# Patient Record
Sex: Male | Born: 1990 | Race: Black or African American | Hispanic: No | Marital: Single | Smoking: Current every day smoker
Health system: Southern US, Community
[De-identification: ages and names within clinical notes are randomized; demographics above are authoritative.]

## PROBLEM LIST (undated history)

## (undated) DIAGNOSIS — J302 Other seasonal allergic rhinitis: Secondary | ICD-10-CM

## (undated) DIAGNOSIS — J40 Bronchitis, not specified as acute or chronic: Secondary | ICD-10-CM

## (undated) DIAGNOSIS — K5190 Ulcerative colitis, unspecified, without complications: Secondary | ICD-10-CM

---
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
## 2010-10-31 NOTE — Op Note (Signed)
NAME:  Roy Arellano, Roy Arellano                 ACCOUNT NO.:  [id number]   MEDICAL RECORD NO.:  [id number]          PATIENT TYPE:  AMB   LOCATION:  SDS                          FACILITY:  [facility]   PHYSICIAN:  Jon Gills, M.D.  DATE OF BIRTH:  02/19/1991   DATE OF PROCEDURE:  08/06/2008  DATE OF DISCHARGE:  08/06/2008                               OPERATIVE REPORT   PREOPERATIVE DIAGNOSIS:  Lower gastrointestinal bleeding.   POSTOPERATIVE DIAGNOSIS:  Diffuse colitis.   PROCEDURE:  Colonoscopy with biopsy.   SURGEON:  Jon Gills, MD   ASSISTANTS:  None.   DESCRIPTION OF FINDINGS:  Following informed written consent, the  patient was taken to the operating room and placed under general  anesthesia with continuous cardiopulmonary monitoring.  He remained in  the supine position and examination of the perineum revealed no tags or  fissures.  Digital examination of the rectum revealed an empty rectal  vault.  The Pentax colonoscope was passed per rectum and advanced  without difficulty 140 cm which corresponded to the mid ascending colon.  The entire colonic mucosa revealed diffuse granularity, edema, and  erythema with relative sparing in the sigmoid colon.  I was unable to  advance the endoscope into the cecum or visualize the terminal ileum.  Multiple biopsies were obtained throughout the colon, which revealed  chronic inflammation consistent with inflammatory bowel disease.  The  colonoscope was gradually removed, and the patient was awakened and  taken to recovery room in satisfactory condition.  He will be released  later today to the care of his family.  He was placed on Azulfidine 1.5  g p.o. b.i.d. with plans to schedule a radiographic small bowel series  in the near future.   DESCRIPTION OF TECHNICAL PROCEDURES USED:  Pentax colonoscope with cold  biopsy forceps.   DESCRIPTION OF SPECIMENS REMOVED:  Ascending colon x3 in formalin,  descending colon x3 in formalin, and rectum  x3 in formalin.           ______________________________  Jon Gills, M.D.     JHC/MEDQ  D:  08/23/2008  T:  08/24/2008  Job:  [id number]   cc:   Percell Locus, MD

## 2011-02-26 ENCOUNTER — Other Ambulatory Visit: Payer: Self-pay | Admitting: Pediatrics

## 2011-02-27 NOTE — Telephone Encounter (Signed)
Will get chart.

## 2011-07-25 ENCOUNTER — Other Ambulatory Visit: Payer: Self-pay | Admitting: Pediatrics

## 2011-07-25 DIAGNOSIS — K5190 Ulcerative colitis, unspecified, without complications: Secondary | ICD-10-CM

## 2012-05-09 ENCOUNTER — Encounter (HOSPITAL_BASED_OUTPATIENT_CLINIC_OR_DEPARTMENT_OTHER): Payer: Self-pay | Admitting: Emergency Medicine

## 2012-05-09 ENCOUNTER — Emergency Department (HOSPITAL_BASED_OUTPATIENT_CLINIC_OR_DEPARTMENT_OTHER)
Admission: EM | Admit: 2012-05-09 | Discharge: 2012-05-09 | Disposition: A | Discharge status: Home / Self Care | Payer: PRIVATE HEALTH INSURANCE | Attending: Emergency Medicine | Admitting: Emergency Medicine

## 2012-05-09 DIAGNOSIS — L0201 Cutaneous abscess of face: Secondary | ICD-10-CM | POA: Insufficient documentation

## 2012-05-09 DIAGNOSIS — Z8709 Personal history of other diseases of the respiratory system: Secondary | ICD-10-CM | POA: Insufficient documentation

## 2012-05-09 DIAGNOSIS — L03211 Cellulitis of face: Secondary | ICD-10-CM | POA: Insufficient documentation

## 2012-05-09 DIAGNOSIS — L299 Pruritus, unspecified: Secondary | ICD-10-CM | POA: Insufficient documentation

## 2012-05-09 DIAGNOSIS — K5190 Ulcerative colitis, unspecified, without complications: Secondary | ICD-10-CM | POA: Insufficient documentation

## 2012-05-09 DIAGNOSIS — L089 Local infection of the skin and subcutaneous tissue, unspecified: Secondary | ICD-10-CM

## 2012-05-09 DIAGNOSIS — Z79899 Other long term (current) drug therapy: Secondary | ICD-10-CM | POA: Insufficient documentation

## 2012-05-09 HISTORY — DX: Ulcerative colitis, unspecified, without complications: K51.90

## 2012-05-09 HISTORY — DX: Bronchitis, not specified as acute or chronic: J40

## 2012-05-09 MED ORDER — MUPIROCIN CALCIUM 2 % NA OINT: TOPICAL_OINTMENT | NASAL | Status: DC

## 2012-05-09 NOTE — ED Notes (Signed)
2cm circular area of rash on chin.

## 2012-05-09 NOTE — ED Provider Notes (Signed)
History     CSN: [id number]  Arrival date & time 05/09/12  2005   First MD Initiated Contact with Patient 05/09/12 2053      Chief Complaint  Patient presents with  . Rash    (Consider location/radiation/quality/duration/timing/severity/associated sxs/prior treatment) Patient is a 21 y.o. male presenting with rash. The history is provided by the patient. No language interpreter was used.  Rash  This is a new problem. The current episode started more than 1 week ago. The problem has been gradually worsening. The problem is associated with nothing. There has been no fever. The rash is present on the face. The pain is at a severity of 0/10. The patient is experiencing no pain. Associated symptoms include itching. Treatments tried: blistex and neosporin. Risk factors include new medications.    Past Medical History  Diagnosis Date  . Ulcerative colitis   . Bronchitis     History reviewed. No pertinent past surgical history.  No family history on file.  History  Substance Use Topics  . Smoking status: Never Smoker   . Smokeless tobacco: Never Used  . Alcohol Use: No      Review of Systems  Skin: Positive for itching and rash.  All other systems reviewed and are negative.    Allergies  Review of patient's allergies indicates no known allergies.  Home Medications     BP 120/65  Pulse 81  Temp 99 F (37.2 C) (Oral)  Resp 16  Ht 5\' 10"  (1.778 m)  Wt 135 lb (61.236 kg)  BMI 19.37 kg/m2  SpO2 100%  Physical Exam  Nursing note and vitals reviewed. Constitutional: He is oriented to person, place, and time. He appears well-developed and well-nourished.  HENT:  Head: Normocephalic and atraumatic.       1x1.5 cm area of erythema  Musculoskeletal: Normal range of motion.  Neurological: He is alert and oriented to person, place, and time. He has normal reflexes.  Skin: There is erythema.  Psychiatric: He has a normal mood and affect.    ED Course  Procedures  (including critical care time)  Labs Reviewed - No data to display No results found.   No diagnosis found.    MDM  Area does not appear herpetic,   Possible infection       Lonia Skinner [place], [place] 05/09/12 2115

## 2012-05-09 NOTE — ED Notes (Signed)
Skin rash on chin,dry,pink area, pt sts area itches.

## 2012-05-12 NOTE — ED Provider Notes (Signed)
Medical screening examination/treatment/procedure(s) were performed by non-physician practitioner and as supervising physician I was immediately available for consultation/collaboration.  Raeford Razor, MD 05/12/12 [phone]

## 2012-09-01 ENCOUNTER — Emergency Department (HOSPITAL_BASED_OUTPATIENT_CLINIC_OR_DEPARTMENT_OTHER)
Admission: EM | Admit: 2012-09-01 | Discharge: 2012-09-02 | Disposition: A | Discharge status: Home / Self Care | Payer: No Typology Code available for payment source | Attending: Emergency Medicine | Admitting: Emergency Medicine

## 2012-09-01 ENCOUNTER — Encounter (HOSPITAL_BASED_OUTPATIENT_CLINIC_OR_DEPARTMENT_OTHER): Payer: Self-pay | Admitting: *Deleted

## 2012-09-01 DIAGNOSIS — T148XXA Other injury of unspecified body region, initial encounter: Secondary | ICD-10-CM | POA: Insufficient documentation

## 2012-09-01 DIAGNOSIS — S0993XA Unspecified injury of face, initial encounter: Secondary | ICD-10-CM | POA: Insufficient documentation

## 2012-09-01 DIAGNOSIS — Z8719 Personal history of other diseases of the digestive system: Secondary | ICD-10-CM | POA: Insufficient documentation

## 2012-09-01 DIAGNOSIS — Y9389 Activity, other specified: Secondary | ICD-10-CM | POA: Insufficient documentation

## 2012-09-01 DIAGNOSIS — Y92410 Unspecified street and highway as the place of occurrence of the external cause: Secondary | ICD-10-CM | POA: Insufficient documentation

## 2012-09-01 DIAGNOSIS — S0990XA Unspecified injury of head, initial encounter: Secondary | ICD-10-CM | POA: Insufficient documentation

## 2012-09-01 DIAGNOSIS — Z8709 Personal history of other diseases of the respiratory system: Secondary | ICD-10-CM | POA: Insufficient documentation

## 2012-09-01 DIAGNOSIS — IMO0002 Reserved for concepts with insufficient information to code with codable children: Secondary | ICD-10-CM | POA: Insufficient documentation
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
## 2012-09-02 MED ORDER — NAPROXEN SODIUM 550 MG PO TABS: 550.0000 mg | ORAL_TABLET | Freq: Two times a day (BID) | ORAL | Status: DC | PRN

## 2012-09-02 MED ORDER — NAPROXEN 250 MG PO TABS
500.0000 mg | ORAL_TABLET | Freq: Once | ORAL | Status: AC
  Administered 2012-09-02: 500 mg via ORAL
  Filled 2012-09-02: qty 2

## 2012-09-02 MED ORDER — CYCLOBENZAPRINE HCL 10 MG PO TABS: 10.0000 mg | ORAL_TABLET | Freq: Three times a day (TID) | ORAL | Status: DC | PRN
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
## 2012-09-05 NOTE — ED Notes (Signed)
Opened this chart because pt came to registration asking for work note therefore chart opened to obtain information as to his discharge instructions

## 2012-10-29 ENCOUNTER — Encounter (HOSPITAL_BASED_OUTPATIENT_CLINIC_OR_DEPARTMENT_OTHER): Payer: Self-pay | Admitting: *Deleted

## 2012-10-29 ENCOUNTER — Emergency Department (HOSPITAL_BASED_OUTPATIENT_CLINIC_OR_DEPARTMENT_OTHER)
Admission: EM | Admit: 2012-10-29 | Discharge: 2012-10-29 | Disposition: A | Discharge status: Home / Self Care | Payer: PRIVATE HEALTH INSURANCE | Attending: Emergency Medicine | Admitting: Emergency Medicine

## 2012-10-29 DIAGNOSIS — E860 Dehydration: Secondary | ICD-10-CM | POA: Insufficient documentation

## 2012-10-29 DIAGNOSIS — R112 Nausea with vomiting, unspecified: Secondary | ICD-10-CM | POA: Insufficient documentation

## 2012-10-29 DIAGNOSIS — Z8709 Personal history of other diseases of the respiratory system: Secondary | ICD-10-CM | POA: Insufficient documentation

## 2012-10-29 DIAGNOSIS — Z8719 Personal history of other diseases of the digestive system: Secondary | ICD-10-CM | POA: Insufficient documentation

## 2012-10-29 DIAGNOSIS — Z79899 Other long term (current) drug therapy: Secondary | ICD-10-CM | POA: Insufficient documentation

## 2012-10-29 LAB — URINALYSIS, ROUTINE W REFLEX MICROSCOPIC
Glucose, UA: NEGATIVE mg/dL
Ketones, ur: >80 mg/dL — AB
pH: 6.5 (ref 5.0–8.0)

## 2012-10-29 LAB — CBC WITH DIFFERENTIAL/PLATELET
Basophils Relative: 0 % (ref 0–1)
Eosinophils Absolute: 0 10*3/uL (ref 0.0–0.7)
Eosinophils Relative: 0 % (ref 0–5)
Hemoglobin: 11.9 g/dL — ABNORMAL LOW (ref 13.0–17.0)
Lymphocytes Relative: 17 % (ref 12–46)
MCHC: 33.6 g/dL (ref 30.0–36.0)
Neutrophils Relative %: 67 % (ref 43–77)
RBC: 5.5 MIL/uL (ref 4.22–5.81)
WBC: 7.2 10*3/uL (ref 4.0–10.5)

## 2012-10-29 LAB — COMPREHENSIVE METABOLIC PANEL
ALT: 15 U/L (ref 0–53)
Alkaline Phosphatase: 98 U/L (ref 39–117)
GFR calc Af Amer: >90 mL/min (ref >90)
Glucose, Bld: 100 mg/dL — ABNORMAL HIGH (ref 70–99)
Potassium: 4.3 mEq/L (ref 3.5–5.1)
Sodium: 135 mEq/L (ref 135–145)
Total Protein: 8.6 g/dL — ABNORMAL HIGH (ref 6.0–8.3)

## 2012-10-29 LAB — RAPID URINE DRUG SCREEN, HOSP PERFORMED
Amphetamines: NOT DETECTED
Barbiturates: NOT DETECTED
Benzodiazepines: NOT DETECTED
Cocaine: NOT DETECTED
Tetrahydrocannabinol: POSITIVE — AB

## 2012-10-29 MED ORDER — SODIUM CHLORIDE 0.9 % IV SOLN
1000.0000 mL | Freq: Once | INTRAVENOUS | Status: AC
  Administered 2012-10-29: 1000 mL via INTRAVENOUS

## 2012-10-29 MED ORDER — CEPHALEXIN 500 MG PO CAPS: 500.0000 mg | ORAL_CAPSULE | Freq: Four times a day (QID) | ORAL | Status: DC

## 2012-10-29 MED ORDER — ONDANSETRON 8 MG PO TBDP: 8.0000 mg | ORAL_TABLET | Freq: Three times a day (TID) | ORAL | Status: DC | PRN

## 2012-10-29 MED ORDER — SODIUM CHLORIDE 0.9 % IV SOLN
1000.0000 mL | INTRAVENOUS | Status: DC
  Administered 2012-10-29: 1000 mL via INTRAVENOUS

## 2012-10-29 MED ORDER — CEPHALEXIN 250 MG PO CAPS
250.0000 mg | ORAL_CAPSULE | Freq: Once | ORAL | Status: AC
  Administered 2012-10-29: 250 mg via ORAL
  Filled 2012-10-29: qty 1

## 2012-10-29 MED ORDER — ONDANSETRON HCL 4 MG/2ML IJ SOLN
4.0000 mg | Freq: Once | INTRAMUSCULAR | Status: AC
  Administered 2012-10-29: 4 mg via INTRAVENOUS
  Filled 2012-10-29: qty 2

## 2012-10-29 NOTE — ED Notes (Signed)
Pt c/o n/v x 4 days 

## 2012-10-29 NOTE — ED Notes (Signed)
Coke given to drink per verbal order of MD.

## 2012-10-29 NOTE — ED Provider Notes (Signed)
History     CSN: [id number]  Arrival date & time 10/29/12  1329   First MD Initiated Contact with Patient 10/29/12 1353      Chief Complaint  Patient presents with  . Emesis    (Consider location/radiation/quality/duration/timing/severity/associated sxs/prior treatment) HPI  Patient complaining of nausea and vomiting for four days.  No fever but some chills.  Patient has a history of ulcerative colitis but denies diarrhea, abdominal pain or bleeding.  He feels weak and has some lightheadedness.  He has been able to eat and drink but   Past Medical History  Diagnosis Date  . Ulcerative colitis   . Bronchitis     History reviewed. No pertinent past surgical history.  History reviewed. No pertinent family history.  History  Substance Use Topics  . Smoking status: Never Smoker   . Smokeless tobacco: Never Used  . Alcohol Use: No      Review of Systems  Allergies  Review of patient's allergies indicates no known allergies.  Home Medications     BP 118/56  Pulse 114  Temp(Src) 98.9 F (37.2 C) (Oral)  Resp 16  Ht 5\' 8"  (1.727 m)  Wt 135 lb (61.236 kg)  BMI 20.53 kg/m2  SpO2 100%  Physical Exam  Nursing note and vitals reviewed. Constitutional: He is oriented to person, place, and time. He appears well-developed and well-nourished.  HENT:  Head: Normocephalic and atraumatic.  Right Ear: External ear normal.  Left Ear: External ear normal.  Nose: Nose normal.  Mouth/Throat: Oropharynx is clear and moist.  Eyes: Conjunctivae and EOM are normal. Pupils are equal, round, and reactive to light.  Neck: Normal range of motion. Neck supple.  Cardiovascular: Normal rate, regular rhythm, normal heart sounds and intact distal pulses.   Pulmonary/Chest: Effort normal and breath sounds normal. No respiratory distress. He has no wheezes. He exhibits no tenderness.  Abdominal: Soft. Bowel sounds are normal. He exhibits no distension and no mass. There is no tenderness.  There is no guarding.  Musculoskeletal: Normal range of motion.  Neurological: He is alert and oriented to person, place, and time. He has normal reflexes. He exhibits normal muscle tone. Coordination normal.  Skin: Skin is warm and dry.  Psychiatric: He has a normal mood and affect. His behavior is normal. Judgment and thought content normal.    ED Course  Procedures (including critical care time)  Labs Reviewed  CBC WITH DIFFERENTIAL  COMPREHENSIVE METABOLIC PANEL  URINALYSIS, ROUTINE W REFLEX MICROSCOPIC  LIPASE, BLOOD  URINE RAPID DRUG SCREEN (HOSP PERFORMED)   No results found.   No diagnosis found.    MDM   Results for orders placed during the hospital encounter of 10/29/12  CBC WITH DIFFERENTIAL      Result Value Range   WBC 7.2  4.0 - 10.5 K/uL   RBC 5.50  4.22 - 5.81 MIL/uL   Hemoglobin 11.9 (*) 13.0 - 17.0 g/dL   HCT 40.9 (*) 81.1 - 91.4 %   MCV 64.4 (*) 78.0 - 100.0 fL   MCH 21.6 (*) 26.0 - 34.0 pg   MCHC 33.6  30.0 - 36.0 g/dL   RDW 78.2 (*) 95.6 - 21.3 %   Platelets 381  150 - 400 K/uL   Neutrophils Relative % 67  43 - 77 %   Lymphocytes Relative 17  12 - 46 %   Monocytes Relative 16 (*) 3 - 12 %   Eosinophils Relative 0  0 - 5 %  Basophils Relative 0  0 - 1 %   Neutro Abs 4.8  1.7 - 7.7 K/uL   Lymphs Abs 1.2  0.7 - 4.0 K/uL   Monocytes Absolute 1.2 (*) 0.1 - 1.0 K/uL   Eosinophils Absolute 0.0  0.0 - 0.7 K/uL   Basophils Absolute 0.0  0.0 - 0.1 K/uL   RBC Morphology ELLIPTOCYTES    COMPREHENSIVE METABOLIC PANEL      Result Value Range   Sodium 135  135 - 145 mEq/L   Potassium 4.3  3.5 - 5.1 mEq/L   Chloride 93 (*) 96 - 112 mEq/L   CO2 28  19 - 32 mEq/L   Glucose, Bld 100 (*) 70 - 99 mg/dL   BUN 17  6 - 23 mg/dL   Creatinine, Ser 1.47  0.50 - 1.35 mg/dL   Calcium 82.9 (*) 8.4 - 10.5 mg/dL   Total Protein 8.6 (*) 6.0 - 8.3 g/dL   Albumin 4.6  3.5 - 5.2 g/dL   AST 22  0 - 37 U/L   ALT 15  0 - 53 U/L   Alkaline Phosphatase 98  39 - 117 U/L    Total Bilirubin 0.3  0.3 - 1.2 mg/dL   GFR calc non Af Amer >90  >90 mL/min   GFR calc Af Amer >90  >90 mL/min  URINALYSIS, ROUTINE W REFLEX MICROSCOPIC      Result Value Range   Color, Urine YELLOW  YELLOW   APPearance CLEAR  CLEAR   Specific Gravity, Urine 1.037 (*) 1.005 - 1.030   pH 6.5  5.0 - 8.0   Glucose, UA NEGATIVE  NEGATIVE mg/dL   Hgb urine dipstick TRACE (*) NEGATIVE   Bilirubin Urine SMALL (*) NEGATIVE   Ketones, ur >80 (*) NEGATIVE mg/dL   Protein, ur 562 (*) NEGATIVE mg/dL   Urobilinogen, UA 1.0  0.0 - 1.0 mg/dL   Nitrite NEGATIVE  NEGATIVE   Leukocytes, UA NEGATIVE  NEGATIVE  LIPASE, BLOOD      Result Value Range   Lipase 46  11 - 59 U/L  URINE RAPID DRUG SCREEN (HOSP PERFORMED)      Result Value Range   Opiates NONE DETECTED  NONE DETECTED   Cocaine NONE DETECTED  NONE DETECTED   Benzodiazepines NONE DETECTED  NONE DETECTED   Amphetamines NONE DETECTED  NONE DETECTED   Tetrahydrocannabinol POSITIVE (*) NONE DETECTED   Barbiturates NONE DETECTED  NONE DETECTED  URINE MICROSCOPIC-ADD ON      Result Value Range   Squamous Epithelial / LPF RARE  RARE   WBC, UA 0-2  <3 WBC/hpf   RBC / HPF 0-2  <3 RBC/hpf   Bacteria, UA RARE  RARE   Urine-Other MUCOUS PRESENT       Patient with resolution of vomiting and appears rehydrated.  Rx for zofran given.  Plan zofran and advised that paradoxically thc can cause vomiting.  Patient advised to avoid and have clear liquids.  Will culture urine and start keflex for possible uti.     Hilario Quarry, MD 10/29/12 [phone]

## 2012-10-29 NOTE — ED Notes (Signed)
MD at bedside. 

## 2013-01-25 ENCOUNTER — Emergency Department (HOSPITAL_BASED_OUTPATIENT_CLINIC_OR_DEPARTMENT_OTHER)
Admission: EM | Admit: 2013-01-25 | Discharge: 2013-01-25 | Disposition: A | Discharge status: Home / Self Care | Payer: PRIVATE HEALTH INSURANCE | Attending: Emergency Medicine | Admitting: Emergency Medicine

## 2013-01-25 ENCOUNTER — Encounter (HOSPITAL_BASED_OUTPATIENT_CLINIC_OR_DEPARTMENT_OTHER): Payer: Self-pay | Admitting: Emergency Medicine

## 2013-01-25 DIAGNOSIS — B349 Viral infection, unspecified: Secondary | ICD-10-CM

## 2013-01-25 DIAGNOSIS — Z8719 Personal history of other diseases of the digestive system: Secondary | ICD-10-CM | POA: Insufficient documentation

## 2013-01-25 DIAGNOSIS — B9789 Other viral agents as the cause of diseases classified elsewhere: Secondary | ICD-10-CM | POA: Insufficient documentation

## 2013-01-25 DIAGNOSIS — Z8709 Personal history of other diseases of the respiratory system: Secondary | ICD-10-CM | POA: Insufficient documentation

## 2013-01-25 DIAGNOSIS — R112 Nausea with vomiting, unspecified: Secondary | ICD-10-CM | POA: Insufficient documentation

## 2013-01-25 DIAGNOSIS — R42 Dizziness and giddiness: Secondary | ICD-10-CM | POA: Insufficient documentation

## 2013-01-25 NOTE — ED Notes (Addendum)
Vomited 3-4 times 2 nights ago and 3 times yesterday.  Slept all evening yesterday.  Got up this morning and the N/V is gone but is "fatigued". Wants to "follow up to make sure everything is OK."  Is able to eat and drink today.

## 2013-01-25 NOTE — ED Provider Notes (Signed)
CSN: [id number]     Arrival date & time 01/25/13  1608 History    This chart was scribed for Toy Baker, MD by Quintella Reichert, ED scribe.  This patient was seen in room MH12/MH12 and the patient's care was started at 5:22 PM.     Chief Complaint  Patient presents with  . Fatigue    The history is provided by the patient. No language interpreter was used.    HPI Comments: Roy Arellano is a 22 y.o. male with h/o ulcerative colitis who presents to the Emergency Department complaining of 2 days of fatigue with associated emesis and HA.  Pt states that he developed gradually-worsening fatigue 2 nights ago while he was at work at [employer].  After he went home he vomited 3-4 times that night and 3 times yesterday.  Vomit is described as green.  He denies blood or coffee ground vomit.   He also noted a mild HA yesterday.  Pt took 2 Tylenol PM yesterday afternoon and slept through the evening.  Since yesterday he has not vomited.  Presently he states he is feeling very improved but wanted to "follow up to make sure everything is OK."  He denies fever, diarrhea, ear pain, cough, melena, abdominal pain, or any other associated symptoms.  He denies recent sick contact.   Pt medicates regularly with Lialda for his ulcerative colitis.    Past Medical History  Diagnosis Date  . Ulcerative colitis   . Bronchitis     History reviewed. No pertinent past surgical history.   No family history on file.   History  Substance Use Topics  . Smoking status: Never Smoker   . Smokeless tobacco: Never Used  . Alcohol Use: No     Review of Systems  Constitutional: Positive for fatigue. Negative for fever.  HENT: Negative for ear pain.   Respiratory: Negative for cough.   Gastrointestinal: Positive for nausea and vomiting. Negative for abdominal pain, diarrhea and blood in stool.  Neurological: Positive for headaches.  All other systems reviewed and are negative.      Allergies  Review  of patient's allergies indicates no known allergies.  Home Medications    BP 132/63  Pulse 100  Temp(Src) 98.4 F (36.9 C) (Oral)  Resp 16  Ht 5\' 9"  (1.753 m)  Wt 140 lb (63.504 kg)  BMI 20.67 kg/m2  SpO2 100%  Physical Exam  Nursing note and vitals reviewed. Constitutional: He is oriented to person, place, and time. He appears well-developed and well-nourished.  Non-toxic appearance.  HENT:  Head: Normocephalic and atraumatic.  Eyes: Conjunctivae are normal. Pupils are equal, round, and reactive to light.  Neck: Normal range of motion.  Cardiovascular: Normal rate.   Pulmonary/Chest: Effort normal.  Neurological: He is alert and oriented to person, place, and time.  Skin: Skin is warm and dry.  Psychiatric: He has a normal mood and affect.    ED Course  Procedures (including critical care time)  DIAGNOSTIC STUDIES: Oxygen Saturation is 100% on room air, normal by my interpretation.    COORDINATION OF CARE: 5:26 PM-Informed pt that symptoms were likely due to a self-limited viral infection and only symptomatic relief is currently indicated.  Advised return precautions.  Pt expressed understanding and agreed to plan.    Labs Reviewed - No data to display No results found. No diagnosis found.   MDM  Pt with resolving viral illness    I personally performed the services described in this  documentation, which was scribed in my presence. The recorded information has been reviewed and is accurate.     Toy Baker, MD 01/25/13 [phone]

## 2013-01-27 ENCOUNTER — Encounter (HOSPITAL_BASED_OUTPATIENT_CLINIC_OR_DEPARTMENT_OTHER): Payer: Self-pay | Admitting: *Deleted

## 2013-01-27 DIAGNOSIS — Z8709 Personal history of other diseases of the respiratory system: Secondary | ICD-10-CM | POA: Insufficient documentation

## 2013-01-27 DIAGNOSIS — K859 Acute pancreatitis without necrosis or infection, unspecified: Secondary | ICD-10-CM | POA: Insufficient documentation

## 2013-01-27 DIAGNOSIS — R748 Abnormal levels of other serum enzymes: Secondary | ICD-10-CM | POA: Insufficient documentation

## 2013-01-27 DIAGNOSIS — Z8719 Personal history of other diseases of the digestive system: Secondary | ICD-10-CM | POA: Insufficient documentation

## 2013-01-27 NOTE — ED Notes (Addendum)
Pt was seen here 2 days ago for the same complaint. Pt sts he began vomiting again last night and vomited until about 9 or 10 this morning. Pt sts he took 2 tylenol this am and has felt fine since. Pt is smiling, friendly and in no distress.

## 2013-01-28 ENCOUNTER — Emergency Department (HOSPITAL_BASED_OUTPATIENT_CLINIC_OR_DEPARTMENT_OTHER)
Admission: EM | Admit: 2013-01-28 | Discharge: 2013-01-28 | Disposition: A | Discharge status: Home / Self Care | Payer: PRIVATE HEALTH INSURANCE | Attending: Emergency Medicine | Admitting: Emergency Medicine

## 2013-01-28 DIAGNOSIS — K859 Acute pancreatitis without necrosis or infection, unspecified: Secondary | ICD-10-CM

## 2013-01-28 LAB — CBC WITH DIFFERENTIAL/PLATELET
Basophils Relative: 1 % (ref 0–1)
Eosinophils Relative: 1 % (ref 0–5)
Hemoglobin: 11.7 g/dL — ABNORMAL LOW (ref 13.0–17.0)
Lymphs Abs: 1.8 10*3/uL (ref 0.7–4.0)
MCH: 21.4 pg — ABNORMAL LOW (ref 26.0–34.0)
MCV: 65.8 fL — ABNORMAL LOW (ref 78.0–100.0)
Monocytes Absolute: 0.7 10*3/uL (ref 0.1–1.0)
RBC: 5.47 MIL/uL (ref 4.22–5.81)

## 2013-01-28 LAB — COMPREHENSIVE METABOLIC PANEL
AST: 23 U/L (ref 0–37)
Alkaline Phosphatase: 75 U/L (ref 39–117)
BUN: 13 mg/dL (ref 6–23)
CO2: 30 mEq/L (ref 19–32)
Chloride: 97 mEq/L (ref 96–112)
Creatinine, Ser: 0.9 mg/dL (ref 0.50–1.35)
GFR calc non Af Amer: >90 mL/min (ref >90)
Potassium: 3.8 mEq/L (ref 3.5–5.1)
Total Bilirubin: 0.6 mg/dL (ref 0.3–1.2)

## 2013-01-28 MED ORDER — ONDANSETRON 8 MG PO TBDP: ORAL_TABLET | ORAL | Status: DC

## 2013-01-28 NOTE — ED Provider Notes (Signed)
CSN: [id number]     Arrival date & time 01/27/13  2317 History     First MD Initiated Contact with Patient 01/28/13 0021     Chief Complaint  Patient presents with  . Emesis   (Consider location/radiation/quality/duration/timing/severity/associated sxs/prior Treatment) HPI Comments: Patient complains of nausea and vomiting. He was seen here 2 days with nausea and vomiting that had been going on for the 2 days prior and was diagnosed with a viral gastroenteritis. He states that the vomiting started again yesterday and he had several episodes of vomiting. The vomiting had restarted after eating to display steroids. He states that he took a Tylenol PM and slept through the day and hasn't had any vomiting since. He denies any abdominal pain. He denies any fevers or chills. He denies any diarrhea or blood in his stool. He does have a history of ulcerative colitis but this has not seemed similar to his past flareups of ulcerative colitis.  Patient is a 22 y.o. male presenting with vomiting.  Emesis Associated symptoms: no abdominal pain, no arthralgias, no chills, no diarrhea and no headaches     Past Medical History  Diagnosis Date  . Ulcerative colitis   . Bronchitis    History reviewed. No pertinent past surgical history. No family history on file. History  Substance Use Topics  . Smoking status: Never Smoker   . Smokeless tobacco: Never Used  . Alcohol Use: No    Review of Systems  Constitutional: Negative for fever, chills, diaphoresis and fatigue.  HENT: Negative for congestion, rhinorrhea and sneezing.   Eyes: Negative.   Respiratory: Negative for cough, chest tightness and shortness of breath.   Cardiovascular: Negative for chest pain and leg swelling.  Gastrointestinal: Positive for nausea and vomiting. Negative for abdominal pain, diarrhea and blood in stool.  Genitourinary: Negative for frequency, hematuria, flank pain and difficulty urinating.  Musculoskeletal: Negative  for back pain and arthralgias.  Skin: Negative for rash.  Neurological: Negative for dizziness, speech difficulty, weakness, numbness and headaches.    Allergies  Review of patient's allergies indicates no known allergies.  Home Medications    BP 130/59  Pulse 86  Temp(Src) 98.4 F (36.9 C) (Oral)  Resp 16  Wt 140 lb (63.504 kg)  BMI 20.67 kg/m2  SpO2 100% Physical Exam  Constitutional: He is oriented to person, place, and time. He appears well-developed and well-nourished.  HENT:  Head: Normocephalic and atraumatic.  Eyes: Pupils are equal, round, and reactive to light.  Neck: Normal range of motion. Neck supple.  Cardiovascular: Normal rate, regular rhythm and normal heart sounds.   Pulmonary/Chest: Effort normal and breath sounds normal. No respiratory distress. He has no wheezes. He has no rales. He exhibits no tenderness.  Abdominal: Soft. Bowel sounds are normal. There is no tenderness. There is no rebound and no guarding.  Musculoskeletal: Normal range of motion. He exhibits no edema.  Lymphadenopathy:    He has no cervical adenopathy.  Neurological: He is alert and oriented to person, place, and time.  Skin: Skin is warm and dry. No rash noted.  Psychiatric: He has a normal mood and affect.    ED Course   Procedures (including critical care time)  Results for orders placed during the hospital encounter of 01/28/13  CBC WITH DIFFERENTIAL      Result Value Range   WBC 6.1  4.0 - 10.5 K/uL   RBC 5.47  4.22 - 5.81 MIL/uL   Hemoglobin 11.7 (*) 13.0 - 17.0  g/dL   HCT 45.4 (*) 09.8 - 11.9 %   MCV 65.8 (*) 78.0 - 100.0 fL   MCH 21.4 (*) 26.0 - 34.0 pg   MCHC 32.5  30.0 - 36.0 g/dL   RDW 14.7 (*) 82.9 - 56.2 %   Platelets 299  150 - 400 K/uL   Neutrophils Relative % 57  43 - 77 %   Lymphocytes Relative 29  12 - 46 %   Monocytes Relative 12  3 - 12 %   Eosinophils Relative 1  0 - 5 %   Basophils Relative 1  0 - 1 %   Neutro Abs 3.4  1.7 - 7.7 K/uL   Lymphs Abs  1.8  0.7 - 4.0 K/uL   Monocytes Absolute 0.7  0.1 - 1.0 K/uL   Eosinophils Absolute 0.1  0.0 - 0.7 K/uL   Basophils Absolute 0.1  0.0 - 0.1 K/uL   RBC Morphology TARGET CELLS     Smear Review LARGE PLATELETS PRESENT    COMPREHENSIVE METABOLIC PANEL      Result Value Range   Sodium 137  135 - 145 mEq/L   Potassium 3.8  3.5 - 5.1 mEq/L   Chloride 97  96 - 112 mEq/L   CO2 30  19 - 32 mEq/L   Glucose, Bld 98  70 - 99 mg/dL   BUN 13  6 - 23 mg/dL   Creatinine, Ser 1.30  0.50 - 1.35 mg/dL   Calcium 86.5  8.4 - 78.4 mg/dL   Total Protein 7.8  6.0 - 8.3 g/dL   Albumin 4.8  3.5 - 5.2 g/dL   AST 23  0 - 37 U/L   ALT 15  0 - 53 U/L   Alkaline Phosphatase 75  39 - 117 U/L   Total Bilirubin 0.6  0.3 - 1.2 mg/dL   GFR calc non Af Amer >90  >90 mL/min   GFR calc Af Amer >90  >90 mL/min  LIPASE, BLOOD      Result Value Range   Lipase 130 (*) 11 - 59 U/L   No results found.   No results found. 1. Pancreatitis     MDM  Patient presents with nausea and vomiting. His lipase is minimally elevated. This could be the cause of his symptoms. It didn't seem to be triggered after each inflation which. He has no symptoms of abdominal pain that would be concerning for cholecystitis. He has no vomiting here in the ED. He's otherwise well-appearing. I advised them in clear liquids for the next 48 hours and then to slowly progress after that. I gave him referral to followup with a gastroenterologist. Advised to return here as needed for any worsening symptoms.  Rolan Bucco, MD 01/28/13 [phone]

## 2013-04-06 ENCOUNTER — Emergency Department (HOSPITAL_BASED_OUTPATIENT_CLINIC_OR_DEPARTMENT_OTHER): Payer: PRIVATE HEALTH INSURANCE

## 2013-04-06 ENCOUNTER — Encounter (HOSPITAL_BASED_OUTPATIENT_CLINIC_OR_DEPARTMENT_OTHER): Payer: Self-pay | Admitting: Emergency Medicine

## 2013-04-06 ENCOUNTER — Emergency Department (HOSPITAL_BASED_OUTPATIENT_CLINIC_OR_DEPARTMENT_OTHER)
Admission: EM | Admit: 2013-04-06 | Discharge: 2013-04-06 | Disposition: A | Discharge status: Home / Self Care | Payer: PRIVATE HEALTH INSURANCE | Attending: Emergency Medicine | Admitting: Emergency Medicine

## 2013-04-06 DIAGNOSIS — B9789 Other viral agents as the cause of diseases classified elsewhere: Secondary | ICD-10-CM | POA: Insufficient documentation

## 2013-04-06 DIAGNOSIS — Z8709 Personal history of other diseases of the respiratory system: Secondary | ICD-10-CM | POA: Insufficient documentation

## 2013-04-06 DIAGNOSIS — R509 Fever, unspecified: Secondary | ICD-10-CM | POA: Insufficient documentation

## 2013-04-06 DIAGNOSIS — Z8719 Personal history of other diseases of the digestive system: Secondary | ICD-10-CM | POA: Insufficient documentation

## 2013-04-06 DIAGNOSIS — R0602 Shortness of breath: Secondary | ICD-10-CM | POA: Insufficient documentation

## 2013-04-06 DIAGNOSIS — F17200 Nicotine dependence, unspecified, uncomplicated: Secondary | ICD-10-CM | POA: Insufficient documentation

## 2013-04-06 DIAGNOSIS — B349 Viral infection, unspecified: Secondary | ICD-10-CM

## 2013-04-06 DIAGNOSIS — J3489 Other specified disorders of nose and nasal sinuses: Secondary | ICD-10-CM | POA: Insufficient documentation

## 2013-04-06 LAB — BASIC METABOLIC PANEL
BUN: 11 mg/dL (ref 6–23)
Calcium: 10.2 mg/dL (ref 8.4–10.5)
Creatinine, Ser: 0.7 mg/dL (ref 0.50–1.35)
GFR calc Af Amer: >90 mL/min (ref >90)
GFR calc non Af Amer: >90 mL/min (ref >90)
Glucose, Bld: 139 mg/dL — ABNORMAL HIGH (ref 70–99)

## 2013-04-06 LAB — CBC WITH DIFFERENTIAL/PLATELET
Basophils Absolute: 0 10*3/uL (ref 0.0–0.1)
Eosinophils Absolute: 0 10*3/uL (ref 0.0–0.7)
Lymphs Abs: 0.5 10*3/uL — ABNORMAL LOW (ref 0.7–4.0)
MCHC: 32.6 g/dL (ref 30.0–36.0)
MCV: 67.2 fL — ABNORMAL LOW (ref 78.0–100.0)
Monocytes Absolute: 0.2 10*3/uL (ref 0.1–1.0)
Monocytes Relative: 3 % (ref 3–12)
Neutro Abs: 7.4 10*3/uL (ref 1.7–7.7)
Platelets: 232 10*3/uL (ref 150–400)
RDW: 19 % — ABNORMAL HIGH (ref 11.5–15.5)
WBC: 8.1 10*3/uL (ref 4.0–10.5)

## 2013-04-06 MED ORDER — ONDANSETRON HCL 4 MG/2ML IJ SOLN
4.0000 mg | Freq: Once | INTRAMUSCULAR | Status: AC
  Administered 2013-04-06: 4 mg via INTRAVENOUS
  Filled 2013-04-06: qty 2

## 2013-04-06 MED ORDER — SODIUM CHLORIDE 0.9 % IV BOLUS (SEPSIS)
1000.0000 mL | Freq: Once | INTRAVENOUS | Status: AC
  Administered 2013-04-06: 1000 mL via INTRAVENOUS

## 2013-04-06 MED ORDER — ONDANSETRON HCL 4 MG PO TABS: 4.0000 mg | ORAL_TABLET | Freq: Three times a day (TID) | ORAL | Status: DC | PRN

## 2013-04-06 NOTE — ED Notes (Signed)
Pt reports generalized weakness, body aches and cold symptoms unrelieved after taking OTC medications.

## 2013-04-06 NOTE — ED Provider Notes (Signed)
CSN: [id number]     Arrival date & time 04/06/13  1009 History   First MD Initiated Contact with Patient 04/06/13 1038     Chief Complaint  Patient presents with  . Cough  . Nasal Congestion  . Fever  . Shortness of Breath   (Consider location/radiation/quality/duration/timing/severity/associated sxs/prior Treatment) Patient is a 22 y.o. male presenting with cough, fever, and shortness of breath.  Cough Associated symptoms: fever and shortness of breath   Fever Associated symptoms: cough   Shortness of Breath Associated symptoms: cough and fever    Pt with history of ulcerative colitis reports about 4-5 days of URI symptoms, cough productive of clear sputum, nasal congestion, subjective fever. Associated with SOB and chest soreness as well as occasional vomiting. No diarrhea abdominal pain or blood in stools.   Past Medical History  Diagnosis Date  . Ulcerative colitis   . Bronchitis    History reviewed. No pertinent past surgical history. No family history on file. History  Substance Use Topics  . Smoking status: Current Some Day Smoker    Types: Cigarettes  . Smokeless tobacco: Never Used  . Alcohol Use: Yes     Comment: weekends    Review of Systems  Constitutional: Positive for fever.  Respiratory: Positive for cough and shortness of breath.    All other systems reviewed and are negative except as noted in HPI.   Allergies  Review of patient's allergies indicates no known allergies.  Home Medications    BP 140/74  Pulse 78  Temp(Src) 97.4 F (36.3 C) (Oral)  Resp 16  SpO2 100% Physical Exam  Nursing note and vitals reviewed. Constitutional: He is oriented to person, place, and time. He appears well-developed and well-nourished.  HENT:  Head: Normocephalic and atraumatic.  Eyes: EOM are normal. Pupils are equal, round, and reactive to light.  Neck: Normal range of motion. Neck supple.  Cardiovascular: Normal rate, normal heart sounds and intact distal  pulses.   Pulmonary/Chest: Effort normal and breath sounds normal.  Abdominal: Bowel sounds are normal. He exhibits no distension. There is no tenderness.  Musculoskeletal: Normal range of motion. He exhibits no edema and no tenderness.  Neurological: He is alert and oriented to person, place, and time. He has normal strength. No cranial nerve deficit or sensory deficit.  Skin: Skin is warm and dry. No rash noted.  Psychiatric: He has a normal mood and affect.    ED Course  Procedures (including critical care time) Labs Review Labs Reviewed  CBC WITH DIFFERENTIAL - Abnormal; Notable for the following:    Hemoglobin 10.4 (*)    HCT 31.9 (*)    MCV 67.2 (*)    MCH 21.9 (*)    RDW 19.0 (*)    Neutrophils Relative % 91 (*)    Lymphocytes Relative 6 (*)    Lymphs Abs 0.5 (*)    All other components within normal limits  BASIC METABOLIC PANEL - Abnormal; Notable for the following:    Chloride 94 (*)    Glucose, Bld 139 (*)    All other components within normal limits   Imaging Review Dg Chest 2 View  04/06/2013   CLINICAL DATA:  Flu-like symptoms; nausea and vomiting  EXAM: CHEST  2 VIEW  COMPARISON:  June 28, 2012  FINDINGS: The lungs are clear. Heart size and pulmonary vascularity are normal. No adenopathy. No bone lesions.  IMPRESSION: No abnormality noted.   Electronically Signed   By: Bretta Bang M.D.  On: 04/06/2013 11:50    EKG Interpretation   None       MDM   1. Viral syndrome     Labs and imaging reviewed and unremarkable. He has anemia at baseline, presumably from UC but no significant change.Pt feeling better and ready to go home. Has viral URI.     Charles B. Bernette Mayers, MD 04/06/13 1240

## 2013-04-06 NOTE — Discharge Instructions (Signed)
Viral Syndrome  You or your child has Viral Syndrome. It is the most common infection causing "colds" and infections in the nose, throat, sinuses, and breathing tubes. Sometimes the infection causes nausea, vomiting, or diarrhea. The germ that causes the infection is a virus. No antibiotic or other medicine will kill it. There are medicines that you can take to make you or your child more comfortable.   HOME CARE INSTRUCTIONS    Rest in bed until you start to feel better.   If you have diarrhea or vomiting, eat small amounts of crackers and toast. Soup is helpful.   Do not give aspirin or medicine that contains aspirin to children.   Only take over-the-counter or prescription medicines for pain, discomfort, or fever as directed by your caregiver.  SEEK IMMEDIATE MEDICAL CARE IF:    You or your child has not improved within one week.   You or your child has pain that is not at least partially relieved by over-the-counter medicine.   Thick, colored mucus or blood is coughed up.   Discharge from the nose becomes thick yellow or green.   Diarrhea or vomiting gets worse.   There is any major change in your or your child's condition.   You or your child develops a skin rash, stiff neck, severe headache, or are unable to hold down food or fluid.   You or your child has an oral temperature above 102 F (38.9 C), not controlled by medicine.   Your baby is older than 3 months with a rectal temperature of 102 F (38.9 C) or higher.   Your baby is 3 months old or younger with a rectal temperature of 100.4 F (38 C) or higher.  Document Released: 05/20/2006 Document Revised: 08/27/2011 Document Reviewed: 05/21/2007  [employer] Patient Information 2014 [employer].

## 2013-04-06 NOTE — ED Notes (Signed)
Pt reports cough, fever, congestion, and SHOB since Friday.  Symptoms are unrelieved after taking OTC medications.

## 2013-04-08 ENCOUNTER — Encounter (HOSPITAL_BASED_OUTPATIENT_CLINIC_OR_DEPARTMENT_OTHER): Payer: Self-pay | Admitting: Emergency Medicine

## 2013-04-08 ENCOUNTER — Emergency Department (HOSPITAL_BASED_OUTPATIENT_CLINIC_OR_DEPARTMENT_OTHER)
Admission: EM | Admit: 2013-04-08 | Discharge: 2013-04-08 | Disposition: A | Discharge status: Home / Self Care | Payer: PRIVATE HEALTH INSURANCE | Attending: Emergency Medicine | Admitting: Emergency Medicine

## 2013-04-08 DIAGNOSIS — J029 Acute pharyngitis, unspecified: Secondary | ICD-10-CM | POA: Insufficient documentation

## 2013-04-08 DIAGNOSIS — R51 Headache: Secondary | ICD-10-CM | POA: Insufficient documentation

## 2013-04-08 DIAGNOSIS — K5289 Other specified noninfective gastroenteritis and colitis: Secondary | ICD-10-CM | POA: Insufficient documentation

## 2013-04-08 DIAGNOSIS — B349 Viral infection, unspecified: Secondary | ICD-10-CM

## 2013-04-08 DIAGNOSIS — R630 Anorexia: Secondary | ICD-10-CM

## 2013-04-08 DIAGNOSIS — B9789 Other viral agents as the cause of diseases classified elsewhere: Secondary | ICD-10-CM | POA: Insufficient documentation

## 2013-04-08 DIAGNOSIS — F17200 Nicotine dependence, unspecified, uncomplicated: Secondary | ICD-10-CM | POA: Insufficient documentation

## 2013-04-08 DIAGNOSIS — Z8709 Personal history of other diseases of the respiratory system: Secondary | ICD-10-CM | POA: Insufficient documentation

## 2013-04-08 DIAGNOSIS — K529 Noninfective gastroenteritis and colitis, unspecified: Secondary | ICD-10-CM

## 2013-04-08 NOTE — ED Notes (Signed)
Pt is feeling better states he just thought he should be 100  % now after having GI bug on Monday also per pts chart the med prescribed was zofran for nausea Spoke with pt about hydrating at home with gatorade etc and the [employer] as well as how to advance diet. Pt verbalizes understanding

## 2013-04-08 NOTE — ED Provider Notes (Signed)
CSN: [id number]     Arrival date & time 04/08/13  0909 History   First MD Initiated Contact with Patient 04/08/13 [phone]     Chief Complaint  Patient presents with  . decreased appetite after virus     HPI  Patient seen and evaluated 2 days ago. Diagnosed with viral syndrome after reassuring labs are normal chest x-ray. He states this is a poor appetite and body aches. His sugar breath. There have rash or joint pain. No chest pain or shortness of breath. No sinus pressure. His sore throat is improving no ear pain. Mild headache. No neck stiffness. He states in general he does for better than 2 days ago "it's not long".  Past Medical History  Diagnosis Date  . Ulcerative colitis   . Bronchitis    History reviewed. No pertinent past surgical history. History reviewed. No pertinent family history. History  Substance Use Topics  . Smoking status: Current Some Day Smoker    Types: Cigarettes  . Smokeless tobacco: Never Used  . Alcohol Use: Yes     Comment: weekends    Review of Systems  Constitutional: Negative for fever, chills, diaphoresis, appetite change and fatigue.  HENT: Positive for sore throat. Negative for mouth sores and trouble swallowing.   Eyes: Negative for visual disturbance.  Respiratory: Negative for cough, chest tightness, shortness of breath and wheezing.   Cardiovascular: Negative for chest pain.  Gastrointestinal: Negative for nausea, vomiting, abdominal pain, diarrhea and abdominal distention.  Endocrine: Negative for polydipsia, polyphagia and polyuria.  Genitourinary: Negative for dysuria, frequency and hematuria.  Musculoskeletal: Negative for gait problem.  Skin: Negative for color change, pallor and rash.  Neurological: Positive for headaches. Negative for dizziness, syncope and light-headedness.  Hematological: Does not bruise/bleed easily.  Psychiatric/Behavioral: Negative for behavioral problems and confusion.    Allergies  Review of patient's  allergies indicates no known allergies.  Home Medications    BP 166/78  Pulse 79  Temp(Src) 98.3 F (36.8 C) (Oral)  Resp 20  Ht 5\' 8"  (1.727 m)  Wt 135 lb (61.236 kg)  BMI 20.53 kg/m2  SpO2 98% Physical Exam  Constitutional: He is oriented to person, place, and time. He appears well-developed and well-nourished. No distress.  HENT:  Head: Normocephalic.  Pharynx is benign. No exudate. No petechiae. No adenopathy in the neck.  Eyes: Conjunctivae are normal. Pupils are equal, round, and reactive to light. No scleral icterus.  Neck: Normal range of motion. Neck supple. No thyromegaly present.  No meningismus  Cardiovascular: Normal rate and regular rhythm.  Exam reveals no gallop and no friction rub.   No murmur heard. Pulmonary/Chest: Effort normal and breath sounds normal. No respiratory distress. He has no wheezes. He has no rales.  Lungs are clear. No crackles or rales. No prolongation or wheezing.  Abdominal: Soft. Bowel sounds are normal. He exhibits no distension. There is no tenderness. There is no rebound.  Musculoskeletal: Normal range of motion.  Neurological: He is alert and oriented to person, place, and time.  Skin: Skin is warm and dry. No rash noted.  Psychiatric: He has a normal mood and affect. His behavior is normal.    ED Course  Procedures (including critical care time) Labs Review Labs Reviewed - No data to display Imaging Review No results found.  EKG Interpretation   None       MDM   1. Anorexia symptom   2. Viral syndrome   3. Gastroenteritis    Patient appears  quite nontoxic. Is not febrile. He is not hypoxemic. Is not cardiac. He still feels some body aches. Exam is otherwise reassuring. He is appropriate for outpatient followup and continued expectant management    Roney Marion, MD 04/09/13 [phone]

## 2013-04-08 NOTE — ED Notes (Signed)
Pt states he was seen here Monday and diagnosed and treated for GI states was given iv fluid and phenergan to take at home has been taking phenergan nausea is better states he had to force himself to eat a bowl of soup last night but was able to keep it down is concerned he still feels "not hungry" and "worn out"

## 2013-05-29 ENCOUNTER — Emergency Department (HOSPITAL_BASED_OUTPATIENT_CLINIC_OR_DEPARTMENT_OTHER)
Admission: EM | Admit: 2013-05-29 | Discharge: 2013-05-29 | Disposition: A | Discharge status: Home / Self Care | Payer: PRIVATE HEALTH INSURANCE | Attending: Emergency Medicine | Admitting: Emergency Medicine

## 2013-05-29 ENCOUNTER — Emergency Department (HOSPITAL_BASED_OUTPATIENT_CLINIC_OR_DEPARTMENT_OTHER): Payer: PRIVATE HEALTH INSURANCE

## 2013-05-29 ENCOUNTER — Encounter (HOSPITAL_BASED_OUTPATIENT_CLINIC_OR_DEPARTMENT_OTHER): Payer: Self-pay | Admitting: Emergency Medicine

## 2013-05-29 DIAGNOSIS — J069 Acute upper respiratory infection, unspecified: Secondary | ICD-10-CM | POA: Insufficient documentation

## 2013-05-29 DIAGNOSIS — Z87891 Personal history of nicotine dependence: Secondary | ICD-10-CM | POA: Insufficient documentation

## 2013-05-29 DIAGNOSIS — Z8719 Personal history of other diseases of the digestive system: Secondary | ICD-10-CM | POA: Insufficient documentation

## 2013-05-29 DIAGNOSIS — R110 Nausea: Secondary | ICD-10-CM | POA: Insufficient documentation

## 2013-05-29 MED ORDER — IBUPROFEN 200 MG PO TABS
600.0000 mg | ORAL_TABLET | Freq: Once | ORAL | Status: AC
  Administered 2013-05-29: 600 mg via ORAL
  Filled 2013-05-29 (×2): qty 1

## 2013-05-29 MED ORDER — BENZONATATE 100 MG PO CAPS: 100.0000 mg | ORAL_CAPSULE | Freq: Three times a day (TID) | ORAL | Status: DC

## 2013-05-29 MED ORDER — GUAIFENESIN ER 600 MG PO TB12: 600.0000 mg | ORAL_TABLET | Freq: Two times a day (BID) | ORAL | Status: DC

## 2013-05-29 NOTE — ED Provider Notes (Signed)
CSN: [id number]     Arrival date & time 05/29/13  2241 History  This chart was scribed for Delania Ferg Smitty Cords, MD by Caryn Bee, ED Scribe. This patient was seen in room MH02/MH02 and the patient's care was started 11:30 PM.    Chief Complaint  Patient presents with  . Nasal Congestion  . Cough   Patient is a 22 y.o. male presenting with cough. The history is provided by the patient. No language interpreter was used.  Cough Cough characteristics:  Non-productive Severity:  Mild Onset quality:  Gradual Duration:  4 days Timing:  Intermittent Progression:  Unchanged Chronicity:  New Smoker: no   Context: upper respiratory infection   Relieved by:  Nothing Worsened by:  Nothing tried Ineffective treatments: Nyquil. Associated symptoms: rhinorrhea and sinus congestion   Associated symptoms: no fever   Risk factors: no recent travel    HPI Comments: Roy Arellano is a 22 y.o. male who presents to the Emergency Department complaining of gradual onset mild cough that began 4 days ago. Pt reports associated rhinorrhea, nausea, nasal congestion. He has taken nyquil with no relief. He denies fever and other symptoms. Pt does not have PCP.    Past Medical History  Diagnosis Date  . Ulcerative colitis   . Bronchitis    History reviewed. No pertinent past surgical history. No family history on file. History  Substance Use Topics  . Smoking status: Former Games developer  . Smokeless tobacco: Never Used  . Alcohol Use: No    Review of Systems  Constitutional: Negative for fever.  HENT: Positive for congestion and rhinorrhea.   Respiratory: Positive for cough.   Gastrointestinal: Positive for nausea.  All other systems reviewed and are negative.    Allergies  Review of patient's allergies indicates no known allergies.  Home Medications    BP 139/60  Pulse 69  Temp(Src) 98.8 F (37.1 C) (Oral)  Resp 16  Ht 5\' 10"  (1.778 m)  Wt 140 lb (63.504 kg)  BMI 20.09 kg/m2  SpO2  100%  Physical Exam  Nursing note and vitals reviewed. Constitutional: He is oriented to person, place, and time. He appears well-developed and well-nourished. No distress.  HENT:  Head: Normocephalic and atraumatic.  Right Ear: Tympanic membrane and ear canal normal.  Left Ear: Tympanic membrane and ear canal normal.  Mouth/Throat: Uvula is midline and oropharynx is clear and moist. No oropharyngeal exudate.  Eyes: Conjunctivae and EOM are normal. Pupils are equal, round, and reactive to light.  Neck: Normal range of motion. Neck supple.  Cardiovascular: Normal rate, regular rhythm and normal heart sounds.  Exam reveals no gallop and no friction rub.   No murmur heard. Pulmonary/Chest: Effort normal and breath sounds normal. No respiratory distress. He has no wheezes. He has no rales. He exhibits no tenderness.  Abdominal: Soft. Bowel sounds are normal. He exhibits no distension and no mass. There is no tenderness. There is no rebound and no guarding.  Musculoskeletal: Normal range of motion.  Neurological: He is alert and oriented to person, place, and time. He has normal reflexes.  Skin: Skin is warm and dry. He is not diaphoretic.  Psychiatric: He has a normal mood and affect.    ED Course  Procedures (including critical care time) DIAGNOSTIC STUDIES: Oxygen Saturation is 100% on room air, normal by my interpretation.    COORDINATION OF CARE: 11:36 PM-Discussed treatment plan with pt at bedside and pt agreed to plan.   Labs Review Labs Reviewed -  No data to display Imaging Review No results found.  EKG Interpretation   None       MDM  No diagnosis found. uri I personally performed the services described in this documentation, which was scribed in my presence. The recorded information has been reviewed and is accurate.    Jasmine Awe, MD 05/30/13 [phone]

## 2013-05-29 NOTE — ED Notes (Signed)
Cough, sinus congestion, runny nose, nausea x4 days.  Denies vomiting/diarrhea. Denies fever.

## 2013-05-29 NOTE — ED Notes (Signed)
MD at bedside giving test results and discussing plan of care for DC. 

## 2013-05-29 NOTE — ED Notes (Addendum)
Pt states that he has ben having URI like smyptoms since Sunday night (dry cough, nasal congestion, and mild HA.) Pt states he tried nyquil a couple of times and it did help with the symptoms but because it has lasted a week he was concerned about if it was worse than a common cold.

## 2013-08-30 ENCOUNTER — Encounter (HOSPITAL_BASED_OUTPATIENT_CLINIC_OR_DEPARTMENT_OTHER): Payer: Self-pay | Admitting: Emergency Medicine

## 2013-08-30 ENCOUNTER — Emergency Department (HOSPITAL_BASED_OUTPATIENT_CLINIC_OR_DEPARTMENT_OTHER)
Admission: EM | Admit: 2013-08-30 | Discharge: 2013-08-30 | Disposition: A | Discharge status: Home / Self Care | Payer: No Typology Code available for payment source | Attending: Emergency Medicine | Admitting: Emergency Medicine

## 2013-08-30 DIAGNOSIS — J3489 Other specified disorders of nose and nasal sinuses: Secondary | ICD-10-CM | POA: Insufficient documentation

## 2013-08-30 DIAGNOSIS — J069 Acute upper respiratory infection, unspecified: Secondary | ICD-10-CM | POA: Insufficient documentation

## 2013-08-30 DIAGNOSIS — Z87891 Personal history of nicotine dependence: Secondary | ICD-10-CM | POA: Insufficient documentation

## 2013-08-30 DIAGNOSIS — R51 Headache: Secondary | ICD-10-CM | POA: Insufficient documentation

## 2013-08-30 DIAGNOSIS — J029 Acute pharyngitis, unspecified: Secondary | ICD-10-CM | POA: Insufficient documentation

## 2013-08-30 MED ORDER — CETIRIZINE-PSEUDOEPHEDRINE ER 5-120 MG PO TB12: 1.0000 | ORAL_TABLET | Freq: Two times a day (BID) | ORAL | Status: DC

## 2013-08-30 NOTE — ED Notes (Signed)
Reports headache, fever, sinus congestion, runny nose, sore throat x two days.  Taking OTC meds without much relief.

## 2013-08-30 NOTE — ED Provider Notes (Signed)
CSN: [id number]     Arrival date & time 08/30/13  0020 History  This chart was scribed for Roy Stock Alfonso Patten, MD by Roy Arellano, ED scribe.  This patient was seen in room MH02/MH02 and the patient's care was started at 12:50 AM.   Chief Complaint  Patient presents with  . Fever      Patient is a 23 y.o. male presenting with fever. The history is provided by the patient. No language interpreter was used.  Fever Temp source:  Subjective Onset quality:  Gradual Timing:  Intermittent Progression:  Unchanged Chronicity:  New Ineffective treatments:  Ibuprofen Associated symptoms: congestion, headaches, rhinorrhea and sore throat    HPI Comments: Roy Arellano is a 23 y.o. male who presents to the Emergency Department complaining of rhinorrhea, congestion, subjective fever, and headache, onset two days ago. Patient has been treating symptoms with OTC medications.   Past Medical History  Diagnosis Date  . Ulcerative colitis   . Bronchitis    No past surgical history on file. No family history on file. History  Substance Use Topics  . Smoking status: Former Research scientist (life sciences)  . Smokeless tobacco: Never Used  . Alcohol Use: No    Review of Systems  Constitutional: Positive for fever.  HENT: Positive for congestion, rhinorrhea and sore throat.   Neurological: Positive for headaches.  All other systems reviewed and are negative.      Allergies  Review of patient's allergies indicates no known allergies.  Home Medications    Triage Vitals: BP 127/67  Pulse 84  Temp(Src) 98.8 F (37.1 C) (Oral)  Resp 12  Ht 5\' 9"  (1.753 m)  Wt 145 lb (65.772 kg)  BMI 21.40 kg/m2  SpO2 100% Physical Exam  Nursing note and vitals reviewed. Constitutional: He is oriented to person, place, and time. He appears well-developed and well-nourished. No distress.  HENT:  Head: Normocephalic and atraumatic.  Right Ear: External ear normal.  Left Ear: External ear normal.  Mouth/Throat: Oropharynx  is clear and moist.  Boggy nasal turbidance.   Eyes: EOM are normal. Pupils are equal, round, and reactive to light.  Neck: Normal range of motion. Neck supple. No tracheal deviation present.  Trachea midline.   Cardiovascular: Normal rate, regular rhythm and normal heart sounds.   No murmur heard. Pulmonary/Chest: Effort normal. No respiratory distress. He has no wheezes. He has no rales.  Abdominal: Soft. Bowel sounds are normal. He exhibits no mass. There is no tenderness. There is no rebound and no guarding.  Musculoskeletal: Normal range of motion.  Lymphadenopathy:    He has no cervical adenopathy.  Neurological: He is alert and oriented to person, place, and time.  Skin: Skin is warm and dry.  Psychiatric: He has a normal mood and affect. His behavior is normal.    ED Course  Procedures (including critical care time) DIAGNOSTIC STUDIES: Oxygen Saturation is 100% on room air, normal by my interpretation.    COORDINATION OF CARE: 12:54 AM- Patient advised to treat symptoms with Mucinex D. Pt advised of plan for treatment and pt agrees.    Labs Review Labs Reviewed  RAPID STREP SCREEN   Imaging Review No results found.   EKG Interpretation None      MDM   Final diagnoses:  None   URI will treat symptomatically   I personally performed the services described in this documentation, which was scribed in my presence. The recorded information has been reviewed and is accurate.     Roy Arellano  Alfonso Patten, MD 08/30/13 [phone]

## 2013-08-30 NOTE — Discharge Instructions (Signed)
Cool Mist Vaporizers °Vaporizers may help relieve the symptoms of a cough and cold. They add moisture to the air, which helps mucus to become thinner and less sticky. This makes it easier to breathe and cough up secretions. Cool mist vaporizers do not cause serious burns like hot mist vaporizers ("steamers, humidifiers"). Vaporizers have not been proved to show they help with colds. You should not use a vaporizer if you are allergic to mold.  °HOME CARE INSTRUCTIONS °· Follow the package instructions for the vaporizer. °· Do not use anything other than distilled water in the vaporizer. °· Do not run the vaporizer all of the time. This can cause mold or bacteria to grow in the vaporizer. °· Clean the vaporizer after each time it is used. °· Clean and dry the vaporizer well before storing it. °· Stop using the vaporizer if worsening respiratory symptoms develop. °Document Released: 03/01/2004 Document Revised: 02/04/2013 Document Reviewed: 10/22/2012 °ExitCare® Patient Information ©2014 [employer]. ° °Cough, Adult ° A cough is a reflex. It helps you clear your throat and airways. A cough can help heal your body. A cough can last 2 or 3 weeks (acute) or may last more than 8 weeks (chronic). Some common causes of a cough can include an infection, allergy, or a cold. °HOME CARE °· Only take medicine as told by your doctor. °· If given, take your medicines (antibiotics) as told. Finish them even if you start to feel better. °· Use a cold steam vaporizer or humidier in your home. This can help loosen thick spit (secretions). °· Sleep so you are almost sitting up (semi-upright). Use pillows to do this. This helps reduce coughing. °· Rest as needed. °· Stop smoking if you smoke. °GET HELP RIGHT AWAY IF: °· You have yellowish-white fluid (pus) in your thick spit. °· Your cough gets worse. °· Your medicine does not reduce coughing, and you are losing sleep. °· You cough up blood. °· You have trouble breathing. °· Your pain  gets worse and medicine does not help. °· You have a fever. °MAKE SURE YOU:  °· Understand these instructions. °· Will watch your condition. °· Will get help right away if you are not doing well or get worse. °Document Released: 02/15/2011 Document Revised: 08/27/2011 Document Reviewed: 02/15/2011 °ExitCare® Patient Information ©2014 [employer]. ° °

## 2013-09-24 ENCOUNTER — Encounter (HOSPITAL_BASED_OUTPATIENT_CLINIC_OR_DEPARTMENT_OTHER): Payer: Self-pay | Admitting: Emergency Medicine

## 2013-09-24 DIAGNOSIS — M940 Chondrocostal junction syndrome [Tietze]: Secondary | ICD-10-CM | POA: Insufficient documentation

## 2013-09-24 DIAGNOSIS — J309 Allergic rhinitis, unspecified: Secondary | ICD-10-CM | POA: Insufficient documentation

## 2013-09-24 DIAGNOSIS — Z87891 Personal history of nicotine dependence: Secondary | ICD-10-CM | POA: Insufficient documentation

## 2013-09-24 DIAGNOSIS — K5190 Ulcerative colitis, unspecified, without complications: Secondary | ICD-10-CM | POA: Insufficient documentation

## 2013-09-24 DIAGNOSIS — Z79899 Other long term (current) drug therapy: Secondary | ICD-10-CM | POA: Insufficient documentation

## 2013-09-24 NOTE — ED Notes (Signed)
Sore throat and nausea for 2 days.

## 2013-09-25 ENCOUNTER — Emergency Department (HOSPITAL_BASED_OUTPATIENT_CLINIC_OR_DEPARTMENT_OTHER)
Admission: EM | Admit: 2013-09-25 | Discharge: 2013-09-25 | Disposition: A | Discharge status: Home / Self Care | Payer: No Typology Code available for payment source | Attending: Emergency Medicine | Admitting: Emergency Medicine

## 2013-09-25 DIAGNOSIS — M940 Chondrocostal junction syndrome [Tietze]: Secondary | ICD-10-CM

## 2013-09-25 DIAGNOSIS — J302 Other seasonal allergic rhinitis: Secondary | ICD-10-CM

## 2013-09-25 MED ORDER — CETIRIZINE-PSEUDOEPHEDRINE ER 5-120 MG PO TB12: 1.0000 | ORAL_TABLET | Freq: Two times a day (BID) | ORAL | Status: DC | PRN

## 2013-09-25 MED ORDER — NAPROXEN SODIUM 220 MG PO TABS: ORAL_TABLET | ORAL | Status: DC

## 2013-09-25 NOTE — ED Provider Notes (Addendum)
CSN: [id number]     Arrival date & time 09/24/13  2230 History   First MD Initiated Contact with Patient 09/25/13 0239     Chief Complaint  Patient presents with  . Sore Throat     (Consider location/radiation/quality/duration/timing/severity/associated sxs/prior Treatment) HPI This is a 23 year old male with a two-day history of itchy throat, itchy nose, nasal congestion cough and parasternal chest wall pain. Apart from the chest wall pain his symptoms were transiently relieved with Benadryl. He was nauseated earlier but his nausea has resolved while sleeping in the ED. Symptoms are mild to moderate. He denies fever.  Past Medical History  Diagnosis Date  . Ulcerative colitis   . Bronchitis    History reviewed. No pertinent past surgical history. No family history on file. History  Substance Use Topics  . Smoking status: Former Research scientist (life sciences)  . Smokeless tobacco: Never Used  . Alcohol Use: No    Review of Systems  All other systems reviewed and are negative.  Allergies  Review of patient's allergies indicates no known allergies.  Home Medications   Current Outpatient Rx  Name  Route  Sig  Dispense  Refill  . benzonatate (TESSALON) 100 MG capsule   Oral   Take 1 capsule (100 mg total) by mouth every 8 (eight) hours.   21 capsule   0   . cetirizine-pseudoephedrine (ZYRTEC-D) 5-120 MG per tablet   Oral   Take 1 tablet by mouth 2 (two) times daily.   14 tablet   0   . guaiFENesin (MUCINEX) 600 MG 12 hr tablet   Oral   Take 1 tablet (600 mg total) by mouth 2 (two) times daily.   14 tablet   0   . LIALDA 1.2 G EC tablet      TAKE TWO TABLETS BY MOUTH DAILY--MUST VISIT PCP FOR CONTINUED THERAPY   60 tablet   2   . ondansetron (ZOFRAN) 4 MG tablet   Oral   Take 1 tablet (4 mg total) by mouth every 8 (eight) hours as needed for nausea.   12 tablet   0    BP 115/53  Pulse 78  Temp(Src) 98.4 F (36.9 C) (Oral)  Resp 20  Ht 5\' 9"  (1.753 m)  Wt 145 lb (65.772 kg)   BMI 21.40 kg/m2  SpO2 100%  Physical Exam General: Well-developed, well-nourished male in no acute distress; appearance consistent with age of record HENT: normocephalic; atraumatic; mild nasal congestion; normal-appearing pharynx Eyes: pupils equal, round and reactive to light; extraocular muscles intact Neck: supple Heart: regular rate and rhythm Lungs: clear to auscultation bilaterally Chest: Bilateral parasternal tenderness without deformity or crepitus Abdomen: soft; nondistended; nontender; bowel sounds present Extremities: No deformity; full range of motion Neurologic: Awake, alert and oriented; motor function intact in all extremities and symmetric; no facial droop Skin: Warm and dry Psychiatric: Normal mood and affect    ED Course  Procedures (including critical care time)   MDM    Wynetta Fines, MD 09/25/13 0248  Wynetta Fines, MD 09/25/13 5009

## 2014-02-27 ENCOUNTER — Emergency Department (HOSPITAL_BASED_OUTPATIENT_CLINIC_OR_DEPARTMENT_OTHER): Payer: PRIVATE HEALTH INSURANCE

## 2014-02-27 ENCOUNTER — Encounter (HOSPITAL_BASED_OUTPATIENT_CLINIC_OR_DEPARTMENT_OTHER): Payer: Self-pay | Admitting: Emergency Medicine

## 2014-02-27 DIAGNOSIS — K219 Gastro-esophageal reflux disease without esophagitis: Secondary | ICD-10-CM | POA: Diagnosis not present

## 2014-02-27 DIAGNOSIS — Z8709 Personal history of other diseases of the respiratory system: Secondary | ICD-10-CM | POA: Diagnosis not present

## 2014-02-27 DIAGNOSIS — Z87891 Personal history of nicotine dependence: Secondary | ICD-10-CM | POA: Diagnosis not present

## 2014-02-27 DIAGNOSIS — R1013 Epigastric pain: Secondary | ICD-10-CM | POA: Diagnosis present

## 2014-02-27 NOTE — ED Notes (Signed)
Pt reports abdominal pain, upper chest pain and sore throat x 1 week. No vomiting/diarrhea. No fevers.

## 2014-02-28 ENCOUNTER — Emergency Department (HOSPITAL_BASED_OUTPATIENT_CLINIC_OR_DEPARTMENT_OTHER)
Admission: EM | Admit: 2014-02-28 | Discharge: 2014-02-28 | Disposition: A | Discharge status: Home / Self Care | Payer: PRIVATE HEALTH INSURANCE | Attending: Emergency Medicine | Admitting: Emergency Medicine

## 2014-02-28 ENCOUNTER — Encounter (HOSPITAL_BASED_OUTPATIENT_CLINIC_OR_DEPARTMENT_OTHER): Payer: Self-pay | Admitting: Emergency Medicine

## 2014-02-28 DIAGNOSIS — K219 Gastro-esophageal reflux disease without esophagitis: Secondary | ICD-10-CM

## 2014-02-28 LAB — RAPID STREP SCREEN (MED CTR MEBANE ONLY): Streptococcus, Group A Screen (Direct): NEGATIVE

## 2014-02-28 MED ORDER — OMEPRAZOLE 20 MG PO CPDR: 20.0000 mg | DELAYED_RELEASE_CAPSULE | Freq: Every day | ORAL | Status: DC

## 2014-02-28 MED ORDER — GI COCKTAIL ~~LOC~~
30.0000 mL | Freq: Once | ORAL | Status: AC
  Administered 2014-02-28: 30 mL via ORAL
  Filled 2014-02-28: qty 30

## 2014-02-28 MED ORDER — KETOROLAC TROMETHAMINE 60 MG/2ML IM SOLN
60.0000 mg | Freq: Once | INTRAMUSCULAR | Status: DC
  Filled 2014-02-28: qty 2

## 2014-02-28 MED ORDER — SUCRALFATE 1 GM/10ML PO SUSP: 1.0000 g | Freq: Three times a day (TID) | ORAL | Status: DC

## 2014-02-28 NOTE — ED Provider Notes (Signed)
CSN: [id number]     Arrival date & time 02/27/14  2256 History   First MD Initiated Contact with Patient 02/28/14 [phone]     Chief Complaint  Patient presents with  . Abdominal Pain  . Chest Pain  . Sore Throat     (Consider location/radiation/quality/duration/timing/severity/associated sxs/prior Treatment) Patient is a 23 y.o. male presenting with pharyngitis. The history is provided by the patient.  Sore Throat This is a new problem. The current episode started more than 1 week ago. The problem occurs constantly. The problem has not changed since onset.Pertinent negatives include no chest pain, no headaches and no shortness of breath. Nothing aggravates the symptoms. Nothing relieves the symptoms. He has tried acetaminophen for the symptoms. The treatment provided no relief.  soreness into the collar bones and epigastric pain.  Has had 2 episodes of vomiting one week ago immediately following ingestion of burger at [employer].  No f/c/r.  No rashes.  No travel no tick exposure.    Past Medical History  Diagnosis Date  . Ulcerative colitis   . Bronchitis    History reviewed. No pertinent past surgical history. History reviewed. No pertinent family history. History  Substance Use Topics  . Smoking status: Former Research scientist (life sciences)  . Smokeless tobacco: Never Used  . Alcohol Use: No    Review of Systems  Constitutional: Negative for fever and fatigue.  Respiratory: Negative for shortness of breath and wheezing.   Cardiovascular: Negative for chest pain, palpitations and leg swelling.  Gastrointestinal: Positive for vomiting. Negative for diarrhea and blood in stool.  Neurological: Negative for headaches.  All other systems reviewed and are negative.     Allergies  Review of patient's allergies indicates no known allergies.  Home Medications    BP 127/50  Pulse 90  Resp 16  Ht 5\' 9"  (1.753 m)  Wt 142 lb (64.411 kg)  BMI 20.96 kg/m2  SpO2 100% Physical Exam  Constitutional: He is  oriented to person, place, and time. He appears well-developed and well-nourished. No distress.  HENT:  Head: Normocephalic and atraumatic.  Mouth/Throat: Oropharynx is clear and moist. No oropharyngeal exudate.  Eyes: Conjunctivae and EOM are normal. Pupils are equal, round, and reactive to light.  Neck: Normal range of motion. Neck supple.  Cardiovascular: Normal rate, regular rhythm and intact distal pulses.   Pulmonary/Chest: Effort normal and breath sounds normal. No stridor. He has no wheezes. He has no rales.  Abdominal: Soft. Bowel sounds are normal. He exhibits no distension. There is no tenderness. There is no rebound and no guarding.  Musculoskeletal: Normal range of motion. He exhibits no edema.  Lymphadenopathy:    He has no cervical adenopathy.  Neurological: He is alert and oriented to person, place, and time. He has normal reflexes.  Skin: Skin is warm and dry.  Psychiatric: He has a normal mood and affect.    ED Course  Procedures (including critical care time) Labs Review Labs Reviewed  RAPID STREP SCREEN  CULTURE, GROUP A STREP    Imaging Review Dg Chest 2 View  02/28/2014   CLINICAL DATA:  Chest pain.  EXAM: CHEST  2 VIEW  COMPARISON:  05/29/2013.  FINDINGS: The heart size and mediastinal contours are within normal limits. Both lungs are clear. The visualized skeletal structures are unremarkable.  IMPRESSION: Normal chest x-ray   Electronically Signed   By: Kalman Jewels M.D.   On: 02/28/2014 00:03     EKG Interpretation   Date/Time:  Saturday February 27 2014  23:09:54 EDT Ventricular Rate:  87 PR Interval:  150 QRS Duration: 90 QT Interval:  364 QTC Calculation: 438 R Axis:   74 Text Interpretation:  Normal sinus rhythm Early repolarization Confirmed  by [facility]  MD, Dov Dill ([id number]) on 02/28/2014 2:36:39 AM      MDM   Final diagnoses:  None    PERC negative wells 0.   Well appearing.  Symptoms likely GERD and are improved post medication.   Follow up with your family doctor for recheck/   Jailynne Opperman K Nyle Limb-Rasch, MD 02/28/14 [phone]

## 2014-03-01 LAB — CULTURE, GROUP A STREP

## 2014-09-15 ENCOUNTER — Encounter (HOSPITAL_BASED_OUTPATIENT_CLINIC_OR_DEPARTMENT_OTHER): Payer: Self-pay | Admitting: *Deleted

## 2014-09-15 ENCOUNTER — Emergency Department (HOSPITAL_BASED_OUTPATIENT_CLINIC_OR_DEPARTMENT_OTHER): Payer: 59

## 2014-09-15 ENCOUNTER — Inpatient Hospital Stay (HOSPITAL_BASED_OUTPATIENT_CLINIC_OR_DEPARTMENT_OTHER)
Admission: EM | Admit: 2014-09-15 | Discharge: 2014-09-17 | DRG: 389 | Disposition: A | Discharge status: Home / Self Care | Payer: 59 | Attending: Internal Medicine | Admitting: Internal Medicine

## 2014-09-15 DIAGNOSIS — R109 Unspecified abdominal pain: Secondary | ICD-10-CM | POA: Diagnosis present

## 2014-09-15 DIAGNOSIS — Z7982 Long term (current) use of aspirin: Secondary | ICD-10-CM

## 2014-09-15 DIAGNOSIS — K51919 Ulcerative colitis, unspecified with unspecified complications: Secondary | ICD-10-CM

## 2014-09-15 DIAGNOSIS — R1084 Generalized abdominal pain: Secondary | ICD-10-CM

## 2014-09-15 DIAGNOSIS — D473 Essential (hemorrhagic) thrombocythemia: Secondary | ICD-10-CM | POA: Diagnosis present

## 2014-09-15 DIAGNOSIS — D509 Iron deficiency anemia, unspecified: Secondary | ICD-10-CM | POA: Diagnosis present

## 2014-09-15 DIAGNOSIS — K5190 Ulcerative colitis, unspecified, without complications: Secondary | ICD-10-CM | POA: Diagnosis present

## 2014-09-15 DIAGNOSIS — K51912 Ulcerative colitis, unspecified with intestinal obstruction: Secondary | ICD-10-CM

## 2014-09-15 DIAGNOSIS — D75839 Thrombocytosis, unspecified: Secondary | ICD-10-CM | POA: Diagnosis present

## 2014-09-15 DIAGNOSIS — K561 Intussusception: Secondary | ICD-10-CM | POA: Diagnosis present

## 2014-09-15 DIAGNOSIS — F17210 Nicotine dependence, cigarettes, uncomplicated: Secondary | ICD-10-CM | POA: Diagnosis present

## 2014-09-15 HISTORY — DX: Other seasonal allergic rhinitis: J30.2

## 2014-09-15 LAB — CBC WITH DIFFERENTIAL/PLATELET
BASOS ABS: 0 10*3/uL (ref 0.0–0.1)
Basophils Relative: 0 % (ref 0–1)
EOS PCT: 0 % (ref 0–5)
Eosinophils Absolute: 0 10*3/uL (ref 0.0–0.7)
HEMATOCRIT: 30.3 % — AB (ref 39.0–52.0)
Hemoglobin: 9.3 g/dL — ABNORMAL LOW (ref 13.0–17.0)
Lymphocytes Relative: 19 % (ref 12–46)
Lymphs Abs: 1.2 10*3/uL (ref 0.7–4.0)
MCH: 18.8 pg — ABNORMAL LOW (ref 26.0–34.0)
MCHC: 30.7 g/dL (ref 30.0–36.0)
MCV: 61.2 fL — ABNORMAL LOW (ref 78.0–100.0)
Monocytes Absolute: 0.8 10*3/uL (ref 0.1–1.0)
Monocytes Relative: 13 % — ABNORMAL HIGH (ref 3–12)
Neutro Abs: 4.1 10*3/uL (ref 1.7–7.7)
Neutrophils Relative %: 68 % (ref 43–77)
Platelets: 448 10*3/uL — ABNORMAL HIGH (ref 150–400)
RBC: 4.95 MIL/uL (ref 4.22–5.81)
RDW: 21.9 % — ABNORMAL HIGH (ref 11.5–15.5)
WBC: 6.1 10*3/uL (ref 4.0–10.5)

## 2014-09-15 LAB — URINE MICROSCOPIC-ADD ON

## 2014-09-15 LAB — COMPREHENSIVE METABOLIC PANEL
ALK PHOS: 85 U/L (ref 39–117)
ALT: 15 U/L (ref 0–53)
AST: 27 U/L (ref 0–37)
Albumin: 4.5 g/dL (ref 3.5–5.2)
Anion gap: 8 (ref 5–15)
BUN: 14 mg/dL (ref 6–23)
CO2: 30 mmol/L (ref 19–32)
Calcium: 9.7 mg/dL (ref 8.4–10.5)
Chloride: 98 mmol/L (ref 96–112)
Creatinine, Ser: 0.79 mg/dL (ref 0.50–1.35)
GFR calc Af Amer: >90 mL/min (ref >90)
Glucose, Bld: 100 mg/dL — ABNORMAL HIGH (ref 70–99)
POTASSIUM: 3.7 mmol/L (ref 3.5–5.1)
Sodium: 136 mmol/L (ref 135–145)
Total Bilirubin: 0.1 mg/dL — ABNORMAL LOW (ref 0.3–1.2)
Total Protein: 9 g/dL — ABNORMAL HIGH (ref 6.0–8.3)

## 2014-09-15 LAB — URINALYSIS, ROUTINE W REFLEX MICROSCOPIC
Bilirubin Urine: NEGATIVE
Glucose, UA: NEGATIVE mg/dL
Ketones, ur: NEGATIVE mg/dL
Leukocytes, UA: NEGATIVE
Nitrite: NEGATIVE
PROTEIN: 100 mg/dL — AB
Specific Gravity, Urine: 1.023 (ref 1.005–1.030)
UROBILINOGEN UA: 1 mg/dL (ref 0.0–1.0)
pH: 7.5 (ref 5.0–8.0)

## 2014-09-15 LAB — CBC
HCT: 28.7 % — ABNORMAL LOW (ref 39.0–52.0)
HEMOGLOBIN: 8.3 g/dL — AB (ref 13.0–17.0)
MCH: 18.4 pg — ABNORMAL LOW (ref 26.0–34.0)
MCHC: 28.9 g/dL — AB (ref 30.0–36.0)
MCV: 63.6 fL — ABNORMAL LOW (ref 78.0–100.0)
Platelets: 372 10*3/uL (ref 150–400)
RBC: 4.51 MIL/uL (ref 4.22–5.81)
RDW: 20 % — ABNORMAL HIGH (ref 11.5–15.5)
WBC: 5.9 10*3/uL (ref 4.0–10.5)

## 2014-09-15 LAB — LIPASE, BLOOD: LIPASE: 18 U/L (ref 11–59)

## 2014-09-15 LAB — CREATININE, SERUM
CREATININE: 0.74 mg/dL (ref 0.50–1.35)
GFR calc Af Amer: >90 mL/min (ref >90)
GFR calc non Af Amer: >90 mL/min (ref >90)

## 2014-09-15 MED ORDER — ONDANSETRON HCL 4 MG/2ML IJ SOLN
4.0000 mg | Freq: Once | INTRAMUSCULAR | Status: AC
  Administered 2014-09-15: 4 mg via INTRAVENOUS
  Filled 2014-09-15: qty 2

## 2014-09-15 MED ORDER — IOHEXOL 300 MG/ML  SOLN
25.0000 mL | Freq: Once | INTRAMUSCULAR | Status: AC | PRN
  Administered 2014-09-15: 25 mL via ORAL

## 2014-09-15 MED ORDER — MORPHINE SULFATE 4 MG/ML IJ SOLN
4.0000 mg | INTRAMUSCULAR | Status: DC | PRN
  Administered 2014-09-15 – 2014-09-16 (×3): 4 mg via INTRAVENOUS
  Filled 2014-09-15 (×3): qty 1

## 2014-09-15 MED ORDER — SODIUM CHLORIDE 0.9 % IV SOLN
INTRAVENOUS | Status: DC
  Administered 2014-09-15: 14:00:00 via INTRAVENOUS

## 2014-09-15 MED ORDER — POTASSIUM CHLORIDE IN NACL 20-0.9 MEQ/L-% IV SOLN
INTRAVENOUS | Status: DC
  Administered 2014-09-15: 20:00:00 via INTRAVENOUS
  Filled 2014-09-15 (×5): qty 1000

## 2014-09-15 MED ORDER — ONDANSETRON HCL 4 MG/2ML IJ SOLN
4.0000 mg | Freq: Once | INTRAMUSCULAR | Status: AC
  Administered 2014-09-15: 4 mg via INTRAVENOUS

## 2014-09-15 MED ORDER — ONDANSETRON HCL 4 MG PO TABS
4.0000 mg | ORAL_TABLET | Freq: Four times a day (QID) | ORAL | Status: DC | PRN
  Administered 2014-09-15: 4 mg via ORAL

## 2014-09-15 MED ORDER — PNEUMOCOCCAL VAC POLYVALENT 25 MCG/0.5ML IJ INJ
0.5000 mL | INJECTION | INTRAMUSCULAR | Status: DC
  Filled 2014-09-15 (×2): qty 0.5

## 2014-09-15 MED ORDER — HYDROMORPHONE HCL 1 MG/ML IJ SOLN
1.0000 mg | Freq: Once | INTRAMUSCULAR | Status: AC
  Administered 2014-09-15: 1 mg via INTRAVENOUS
  Filled 2014-09-15: qty 1

## 2014-09-15 MED ORDER — ACETAMINOPHEN 650 MG RE SUPP: 650.0000 mg | Freq: Four times a day (QID) | RECTAL | Status: DC | PRN

## 2014-09-15 MED ORDER — SODIUM CHLORIDE 0.9 % IV BOLUS (SEPSIS)
1000.0000 mL | Freq: Once | INTRAVENOUS | Status: AC
  Administered 2014-09-15: 1000 mL via INTRAVENOUS

## 2014-09-15 MED ORDER — ACETAMINOPHEN 325 MG PO TABS: 650.0000 mg | ORAL_TABLET | Freq: Four times a day (QID) | ORAL | Status: DC | PRN

## 2014-09-15 MED ORDER — ONDANSETRON HCL 4 MG/2ML IJ SOLN
4.0000 mg | Freq: Four times a day (QID) | INTRAMUSCULAR | Status: DC | PRN
  Filled 2014-09-15: qty 2

## 2014-09-15 MED ORDER — ENOXAPARIN SODIUM 40 MG/0.4ML ~~LOC~~ SOLN
40.0000 mg | SUBCUTANEOUS | Status: DC
  Administered 2014-09-15 – 2014-09-16 (×2): 40 mg via SUBCUTANEOUS
  Filled 2014-09-15 (×3): qty 0.4

## 2014-09-15 MED ORDER — IOHEXOL 300 MG/ML  SOLN
100.0000 mL | Freq: Once | INTRAMUSCULAR | Status: AC | PRN
  Administered 2014-09-15: 100 mL via INTRAVENOUS

## 2014-09-15 MED ORDER — PANTOPRAZOLE SODIUM 40 MG IV SOLR
40.0000 mg | INTRAVENOUS | Status: DC
  Administered 2014-09-15 – 2014-09-16 (×2): 40 mg via INTRAVENOUS
  Filled 2014-09-15 (×3): qty 40

## 2014-09-15 MED ORDER — ONDANSETRON HCL 4 MG/2ML IJ SOLN
INTRAMUSCULAR | Status: AC
  Filled 2014-09-15: qty 2

## 2014-09-15 NOTE — ED Notes (Signed)
Pt informed of plan of care.  ETA approx 40 minutes.

## 2014-09-15 NOTE — ED Provider Notes (Signed)
Care assumed from Dr. Rogene Houston at shift change. Patient presented here with complaints of abdominal pain and a history of ulcerative colitis. CT scan was ordered and reveals 2 areas of intussusception in the left upper quadrant small bowel. I've discussed this finding with Dr. Marcello Moores from general surgery who does not feel as though this is emergently surgical. She is recommending admission to medicine if he is not feeling better. I have spoken with Dr. Coralyn Pear who agrees to admit for hydration and pain control.  Veryl Speak, MD 09/15/14 [phone]

## 2014-09-15 NOTE — ED Notes (Signed)
[facility] at bedside preparing for transport.  Pt transported to [facility] and stable upon transfer.  Report called to unit RN.

## 2014-09-15 NOTE — Progress Notes (Signed)
Received report from [employer], pt arrived from [facility] via ambulance. He is alert and oriented, able to verbalize needs. MD notified of pt's location. Will continue with current plan of care.

## 2014-09-15 NOTE — ED Provider Notes (Signed)
CSN: [id number]     Arrival date & time 09/15/14  1033 History   First MD Initiated Contact with Patient 09/15/14 1148     Chief Complaint  Patient presents with  . Abdominal Pain     (Consider location/radiation/quality/duration/timing/severity/associated sxs/prior Treatment) Patient is a 24 y.o. male presenting with abdominal pain. The history is provided by the patient.  Abdominal Pain Associated symptoms: nausea and vomiting   Associated symptoms: no chest pain, no diarrhea, no dysuria, no fever, no hematuria and no shortness of breath    patient with onset of fever over the weekend. Started with nausea vomiting on Monday. Fever resolved. Patient has a history of ulcerative colitis. Followed by GI medicine at [facility]. Patient denies any vomiting of blood. Patient just overall not feeling well. Pain is predominantly right upper quadrant of the abdomen. Patient states that the pain is 8 out of 10 pain is nonradiating it's been constant.  Past Medical History  Diagnosis Date  . Ulcerative colitis   . Bronchitis    History reviewed. No pertinent past surgical history. No family history on file. History  Substance Use Topics  . Smoking status: Former Research scientist (life sciences)  . Smokeless tobacco: Never Used  . Alcohol Use: No    Review of Systems  Constitutional: Negative for fever.  HENT: Negative for congestion.   Eyes: Negative for redness.  Respiratory: Negative for shortness of breath.   Cardiovascular: Negative for chest pain.  Gastrointestinal: Positive for nausea, vomiting and abdominal pain. Negative for diarrhea.  Genitourinary: Negative for dysuria and hematuria.  Musculoskeletal: Negative for back pain.  Skin: Negative for rash.  Neurological: Negative for headaches.  Hematological: Does not bruise/bleed easily.  Psychiatric/Behavioral: Negative for confusion.      Allergies  Review of patient's allergies indicates no known allergies.  Home Medications   Prior to  Admission medications   Medication Sig Start Date End Date Taking? Authorizing Provider  LIALDA 1.2 G EC tablet TAKE TWO TABLETS BY MOUTH DAILY MUST VISIT PCP FOR CONTINUED THERAPY 07/25/11  Yes Oletha Blend, MD  benzonatate (TESSALON) 100 MG capsule Take 1 capsule (100 mg total) by mouth every 8 (eight) hours. 05/29/13   April Palumbo, MD  cetirizine-pseudoephedrine (ZYRTEC-D) 5-120 MG per tablet Take 1 tablet by mouth 2 (two) times daily. 08/30/13   April Palumbo, MD  guaiFENesin (MUCINEX) 600 MG 12 hr tablet Take 1 tablet (600 mg total) by mouth 2 (two) times daily. 05/29/13   April Palumbo, MD  naproxen sodium (ALEVE) 220 MG tablet Take 2 tablets every 12 hours as needed for chest wall pain. Best taken with a meal. 09/25/13   Shanon Rosser, MD  omeprazole (PRILOSEC) 20 MG capsule Take 1 capsule (20 mg total) by mouth daily. 02/28/14   April Palumbo, MD  ondansetron (ZOFRAN) 4 MG tablet Take 1 tablet (4 mg total) by mouth every 8 (eight) hours as needed for nausea. 04/06/13   Calvert Cantor, MD  sucralfate (CARAFATE) 1 GM/10ML suspension Take 10 mLs (1 g total) by mouth 4 (four) times daily -  with meals and at bedtime. 02/28/14   April Palumbo, MD   BP 141/80 mmHg  Pulse 90  Temp(Src) 97.6 F (36.4 C) (Oral)  Resp 16  Ht 5\' 10"  (1.778 m)  Wt 140 lb (63.504 kg)  BMI 20.09 kg/m2  SpO2 100% Physical Exam  Constitutional: He is oriented to person, place, and time. He appears well-developed and well-nourished. No distress.  HENT:  Head: Normocephalic and atraumatic.  Mouth/Throat: Oropharynx is clear and moist.  Eyes: Conjunctivae and EOM are normal. Pupils are equal, round, and reactive to light.  Neck: Normal range of motion.  Cardiovascular: Normal rate, regular rhythm and normal heart sounds.   No murmur heard. Pulmonary/Chest: Effort normal and breath sounds normal. No respiratory distress.  Abdominal: Soft. Bowel sounds are normal. There is no tenderness.  Musculoskeletal: Normal range  of motion.  Neurological: He is alert and oriented to person, place, and time. No cranial nerve deficit. He exhibits normal muscle tone. Coordination normal.  Skin: Skin is warm. No rash noted.  Nursing note and vitals reviewed.   ED Course  Procedures (including critical care time) Labs Review Labs Reviewed  URINALYSIS, ROUTINE W REFLEX MICROSCOPIC - Abnormal; Notable for the following:    APPearance TURBID (*)    Hgb urine dipstick SMALL (*)    Protein, ur 100 (*)    All other components within normal limits  URINE MICROSCOPIC-ADD ON - Abnormal; Notable for the following:    Squamous Epithelial / LPF FEW (*)    Bacteria, UA MANY (*)    All other components within normal limits  COMPREHENSIVE METABOLIC PANEL - Abnormal; Notable for the following:    Glucose, Bld 100 (*)    Total Protein 9.0 (*)    Total Bilirubin 0.1 (*)    All other components within normal limits  CBC WITH DIFFERENTIAL/PLATELET - Abnormal; Notable for the following:    Hemoglobin 9.3 (*)    HCT 30.3 (*)    MCV 61.2 (*)    MCH 18.8 (*)    RDW 21.9 (*)    Platelets 448 (*)    Monocytes Relative 13 (*)    All other components within normal limits  URINE CULTURE  LIPASE, BLOOD   Results for orders placed or performed during the hospital encounter of 09/15/14  Urinalysis, Routine w reflex microscopic  Result Value Ref Range   Color, Urine YELLOW YELLOW   APPearance TURBID (A) CLEAR   Specific Gravity, Urine 1.023 1.005 - 1.030   pH 7.5 5.0 - 8.0   Glucose, UA NEGATIVE NEGATIVE mg/dL   Hgb urine dipstick SMALL (A) NEGATIVE   Bilirubin Urine NEGATIVE NEGATIVE   Ketones, ur NEGATIVE NEGATIVE mg/dL   Protein, ur 100 (A) NEGATIVE mg/dL   Urobilinogen, UA 1.0 0.0 - 1.0 mg/dL   Nitrite NEGATIVE NEGATIVE   Leukocytes, UA NEGATIVE NEGATIVE  Urine microscopic-add on  Result Value Ref Range   Squamous Epithelial / LPF FEW (A) RARE   WBC, UA 0-2 <3 WBC/hpf   RBC / HPF 11-20 <3 RBC/hpf   Bacteria, UA MANY (A)  RARE   Urine-Other AMORPHOUS URATES/PHOSPHATES   Lipase, blood  Result Value Ref Range   Lipase 18 11 - 59 U/L  Comprehensive metabolic panel  Result Value Ref Range   Sodium 136 135 - 145 mmol/L   Potassium 3.7 3.5 - 5.1 mmol/L   Chloride 98 96 - 112 mmol/L   CO2 30 19 - 32 mmol/L   Glucose, Bld 100 (H) 70 - 99 mg/dL   BUN 14 6 - 23 mg/dL   Creatinine, Ser 0.79 0.50 - 1.35 mg/dL   Calcium 9.7 8.4 - 10.5 mg/dL   Total Protein 9.0 (H) 6.0 - 8.3 g/dL   Albumin 4.5 3.5 - 5.2 g/dL   AST 27 0 - 37 U/L   ALT 15 0 - 53 U/L   Alkaline Phosphatase 85 39 - 117 U/L   Total  Bilirubin 0.1 (L) 0.3 - 1.2 mg/dL   GFR calc non Af Amer >90 >90 mL/min   GFR calc Af Amer >90 >90 mL/min   Anion gap 8 5 - 15  CBC with Differential/Platelet  Result Value Ref Range   WBC 6.1 4.0 - 10.5 K/uL   RBC 4.95 4.22 - 5.81 MIL/uL   Hemoglobin 9.3 (L) 13.0 - 17.0 g/dL   HCT 30.3 (L) 39.0 - 52.0 %   MCV 61.2 (L) 78.0 - 100.0 fL   MCH 18.8 (L) 26.0 - 34.0 pg   MCHC 30.7 30.0 - 36.0 g/dL   RDW 21.9 (H) 11.5 - 15.5 %   Platelets 448 (H) 150 - 400 K/uL   Neutrophils Relative % 68 43 - 77 %   Lymphocytes Relative 19 12 - 46 %   Monocytes Relative 13 (H) 3 - 12 %   Eosinophils Relative 0 0 - 5 %   Basophils Relative 0 0 - 1 %   Neutro Abs 4.1 1.7 - 7.7 K/uL   Lymphs Abs 1.2 0.7 - 4.0 K/uL   Monocytes Absolute 0.8 0.1 - 1.0 K/uL   Eosinophils Absolute 0.0 0.0 - 0.7 K/uL   Basophils Absolute 0.0 0.0 - 0.1 K/uL   RBC Morphology ELLIPTOCYTES      Imaging Review No results found.   EKG Interpretation None      MDM   Final diagnoses:  Abdominal pain  Ulcerative colitis, unspecified complication    Patient with history of ulcerative colitis. Patient with onset of fevers over the weekend and then abdominal pain on Monday. Associated with vomiting no diarrhea. Patient has not been able to keep any solid food down for 3 days. Patient is followed by GI physician Dr. Sharyne Peach at [facility].  Labs without  significant abnormalities. CT scan of the abdomen will determine disposition.    Fredia Sorrow, MD 09/15/14 [phone]

## 2014-09-15 NOTE — ED Notes (Signed)
Pt c/o abdominal pain and vomiting x3 days. Pt denies diarrhea.

## 2014-09-15 NOTE — ED Notes (Signed)
Patient transported to CT 

## 2014-09-15 NOTE — H&P (Addendum)
History and Physical:    Roy Arellano   IPJ:[id number] DOB: 1990/11/06 DOA: 09/15/2014  Referring physician: Dr. Stark Jock PCP: No PCP Per Patient  Gastroenterologist: Dr. Sharyne Peach ([facility])  Chief Complaint: Abdominal pain, nausea and vomiting  History of Present Illness:   Roy Legan is an 24 y.o. male with a PMH of ulcerative colitis, diagnosed approximately 5 years ago, who presented to [facility] with a 4 day history of nausea, vomiting and right upper quadrant abdominal pain.  Abdominal pain is crampy, sharp, intermittent and is rated 9/10 at its worst.  Currently reports the pain is a level 6-7, and is "much better" than it has been over the past 48 hours.  No associated diarrhea.  Last BM was 5 days ago.  Upon initial evaluation in the ED, a CT scan showed findings concerning for intussusception.  The patient was subsequently referred to [facility] for inpatient management after the case was discussed with Dr. Leighton Ruff.    ROS:   Constitutional: + fever 4 days ago, none since, no chills;  Appetite diminished; No weight loss, no weight gain, + fatigue.  HEENT: No blurry vision, no diplopia, no pharyngitis, no dysphagia CV: No chest pain, no palpitations, no PND, no orthopnea, no edema.  Resp: No SOB, no cough, no pleuritic pain. GI: + nausea, + vomiting, no diarrhea, no melena, no hematochezia, no constipation, + abdominal pain.  GU: No dysuria, no hematuria, no frequency, no urgency. MSK: no myalgias, no arthralgias.  Neuro:  No headache, no focal neurological deficits, no history of seizures.  Psych: No depression, no anxiety.  Endo: No heat intolerance, no cold intolerance, no polyuria, no polydipsia  Skin: No rashes, no skin lesions.  Heme: No easy bruising.  Travel history: No recent travel.   Past Medical History:   Past Medical History  Diagnosis Date  . Ulcerative colitis   . Bronchitis   . Seasonal allergies     Past Surgical History:   History reviewed. No  pertinent past surgical history.  Social History:   History   Social History  . Marital Status: Single    Spouse Name: N/A  . Number of Children: 0  . Years of Education: N/A   Occupational History  . Carpentry work    Social History Main Topics  . Smoking status: Current Some Day Smoker  . Smokeless tobacco: Never Used  . Alcohol Use: No  . Drug Use: No  . Sexual Activity: Not on file   Other Topics Concern  . Not on file   Social History Narrative   Single. Lives with grandmother/legal guardian. 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    Family history:   Family History  Problem Relation Age of Onset  . Diabetes Maternal Grandmother   . Ulcers Maternal Grandmother     Allergies   Review of patient's allergies indicates no known allergies.  Current Medications:   Prior to Admission medications   Medication Sig Start Date End Date Taking? Authorizing Provider  acetaminophen (TYLENOL) 500 MG tablet Take 1,000 mg by mouth every 6 (six) hours as needed for moderate pain or headache.   Yes Historical Provider, MD  aspirin-acetaminophen-caffeine (EXCEDRIN MIGRAINE) [phone] MG per tablet Take 2 tablets by mouth every 6 (six) hours as needed for headache.   Yes Historical Provider, MD  ibuprofen (ADVIL,MOTRIN) 200 MG tablet Take 400 mg by mouth every 6 (six) hours as needed for headache or moderate pain.  Yes Historical Provider, MD  LIALDA 1.2 G EC tablet TAKE TWO TABLETS BY MOUTH DAILY MUST VISIT PCP FOR CONTINUED THERAPY 07/25/11  Yes Oletha Blend, MD  benzonatate (TESSALON) 100 MG capsule Take 1 capsule (100 mg total) by mouth every 8 (eight) hours. 05/29/13   April Palumbo, MD  cetirizine-pseudoephedrine (ZYRTEC-D) 5-120 MG per tablet Take 1 tablet by mouth 2 (two) times daily. 08/30/13   April Palumbo, MD  guaiFENesin (MUCINEX) 600 MG 12 hr tablet Take 1 tablet (600 mg total) by mouth 2 (two) times daily. 05/29/13   April Palumbo, MD  naproxen sodium  (ALEVE) 220 MG tablet Take 2 tablets every 12 hours as needed for chest wall pain. Best taken with a meal. 09/25/13   Shanon Rosser, MD  omeprazole (PRILOSEC) 20 MG capsule Take 1 capsule (20 mg total) by mouth daily. 02/28/14   April Palumbo, MD  ondansetron (ZOFRAN) 4 MG tablet Take 1 tablet (4 mg total) by mouth every 8 (eight) hours as needed for nausea. 04/06/13   Calvert Cantor, MD  sucralfate (CARAFATE) 1 GM/10ML suspension Take 10 mLs (1 g total) by mouth 4 (four) times daily -  with meals and at bedtime. 02/28/14   April Palumbo, MD    Physical Exam:   Danley Danker Vitals:   09/15/14 1401 09/15/14 1646 09/15/14 1750 09/15/14 1838  BP: 141/80 133/74 120/58 140/62  Pulse: 90 67 87 69  Temp:  98.1 F (36.7 C)  98.7 F (37.1 C)  TempSrc:  Oral  Oral  Resp: 16 16 16 16   Height:      Weight:      SpO2: 100% 100% 97% 100%     Physical Exam: Blood pressure 140/62, pulse 69, temperature 98.7 F (37.1 C), temperature source Oral, resp. rate 16, height 5\' 10"  (1.778 m), weight 63.504 kg (140 lb), SpO2 100 %. Gen: No acute distress. Head: Normocephalic, atraumatic. Eyes: PERRL, EOMI, sclerae nonicteric. Mouth: Oropharynx clear. Neck: Supple, no thyromegaly, no lymphadenopathy, no jugular venous distention. Chest: Lungs clear to auscultation bilaterally. CV: Heart sounds are regular. No murmurs, rubs, or gallops. Abdomen: Soft, mildly tender right upper quadrant, normal active bowel sounds. Extremities: Extremities are without clubbing, edema, or cyanosis Skin: Warm and dry. Neuro: Alert and oriented times 3; cranial nerves II through XII grossly intact. Psych: Mood and affect normal.   Data Review:    Labs: Basic Metabolic Panel:  Recent Labs Lab 09/15/14 1155  NA 136  K 3.7  CL 98  CO2 30  GLUCOSE 100*  BUN 14  CREATININE 0.79  CALCIUM 9.7   Liver Function Tests:  Recent Labs Lab 09/15/14 1155  AST 27  ALT 15  ALKPHOS 85  BILITOT 0.1*  PROT 9.0*  ALBUMIN 4.5     Recent Labs Lab 09/15/14 1155  LIPASE 18   CBC:  Recent Labs Lab 09/15/14 1155  WBC 6.1  NEUTROABS 4.1  HGB 9.3*  HCT 30.3*  MCV 61.2*  PLT 448*    Radiographic Studies: Ct Abdomen Pelvis W Contrast  09/15/2014   CLINICAL DATA:  Right upper quadrant pain since Sunday. Nausea vomiting and diarrhea.  EXAM: CT ABDOMEN AND PELVIS WITH CONTRAST  TECHNIQUE: Multidetector CT imaging of the abdomen and pelvis was performed using the standard protocol following bolus administration of intravenous contrast.  CONTRAST:  30mL OMNIPAQUE IOHEXOL 300 MG/ML SOLN, 35mL OMNIPAQUE IOHEXOL 300 MG/ML SOLN, 143mL OMNIPAQUE IOHEXOL 300 MG/ML SOLN  COMPARISON:  None.  FINDINGS: Lower chest:  Lung bases  are clear.  Hepatobiliary: No focal hepatic lesion. No duct dilatation. There is some thickening through the falciform ligament. Portal veins are patent. Gallbladder is normal.  Pancreas: Pancreas is normal. No ductal dilatation. No pancreatic inflammation.  Spleen: Normal spleen  Adrenals/urinary tract: Adrenal glands and kidneys are normal. Ureters and bladder normal.  Stomach/Bowel: Stomach and duodenum are normal. There is a small bowel small bowel intussusception in the left upper quadrant measuring 4 cm in length seen best on coronal image 33, series 5. There is no lead point mass identified. There is a second short small bowel intussusceptions in left upper quadrant measuring 2 cm on image 37, series 2. Patient did not drink much oral contrast therefore the progression of oral contrast is not well demonstrated. There may be mild small bowel dilatation proximal to the longer intussusception as seen on coronal image 32. The more distal small bowel is normal. The terminal ileum is grossly normal. The colon and rectosigmoid colon appear normal without evidence of acute inflammation. No facial or abscess.  Vascular/Lymphatic: Abdominal aorta is normal caliber. There is no retroperitoneal or periportal  lymphadenopathy. No pelvic lymphadenopathy.  Reproductive: Prostate gland is normal.  Musculoskeletal: No aggressive osseous lesion. Sacroiliac joints are normal. Benign sclerotic lesion in the inferior right iliac bone.  IMPRESSION: 1. Two small bowel intussusceptions in the left upper quadrant; one measuring 4 cm and a second measuring 2 cm. No high-grade obstruction. No lead point mass identified. Typically intussusceptions of 4 cm less are transient. Recommend follow-up imaging to demonstrate resolution. If vomiting persists, surgical consultation may be warranted. 2. No evidence of active inflammatory bowel.   Electronically Signed   By: Suzy Bouchard M.D.   On: 09/15/2014 15:44    Assessment/Plan:   Principal Problem:   Intussusception / abdominal pain - Supportive care with IV fluids, pain medications, antinausea medications, and IV fluids ordered. - Case discussed with Dr. Lucia Gaskins. Surgery will see in the morning. - Nothing by mouth. - Repeat KUB in the morning. - NG tube to low intermittent suction ordered. - No need for GI consultation (did discuss the case with Dr. Penelope Coop) at this point.   - Check TTG IgG as celiac disease is a rare cause of intussusception.  Active Problems:   Ulcerative colitis -  Lialda on hold.     Microcytic anemia - Likely iron deficiency.   - Check ferritin, iron studies.    Thrombocytosis - Likely reactive.    DVT prophylaxis - Lovenox ordered.  Code Status: Full. Family Communication: Jeralyn Ruths (guardian) [phone]. Disposition Plan: Home when stable.  Time spent: 1 hour.  Johnsie Moscoso [facility] Pager [phone] Cell: [phone]   If 7PM-7AM, please contact night-coverage [email or web address] Password [facility] 09/15/2014, 7:46 PM

## 2014-09-15 NOTE — ED Notes (Signed)
Report called to Sherlynn Stalls, RN

## 2014-09-15 NOTE — ED Notes (Signed)
MD at bedside. 

## 2014-09-15 NOTE — ED Notes (Signed)
Patient denies pain and is resting comfortably.  

## 2014-09-16 ENCOUNTER — Encounter (HOSPITAL_COMMUNITY): Payer: Self-pay | Admitting: General Surgery

## 2014-09-16 ENCOUNTER — Inpatient Hospital Stay (HOSPITAL_COMMUNITY): Payer: 59

## 2014-09-16 DIAGNOSIS — K5190 Ulcerative colitis, unspecified, without complications: Secondary | ICD-10-CM

## 2014-09-16 LAB — BASIC METABOLIC PANEL
Anion gap: 6 (ref 5–15)
BUN: 9 mg/dL (ref 6–23)
CHLORIDE: 99 mmol/L (ref 96–112)
CO2: 27 mmol/L (ref 19–32)
Calcium: 8.8 mg/dL (ref 8.4–10.5)
Creatinine, Ser: 0.81 mg/dL (ref 0.50–1.35)
GFR calc Af Amer: >90 mL/min (ref >90)
GFR calc non Af Amer: >90 mL/min (ref >90)
Glucose, Bld: 99 mg/dL (ref 70–99)
Potassium: 3.7 mmol/L (ref 3.5–5.1)
Sodium: 132 mmol/L — ABNORMAL LOW (ref 135–145)

## 2014-09-16 LAB — FERRITIN: FERRITIN: 5 ng/mL — AB (ref 22–322)

## 2014-09-16 LAB — CBC
HCT: 28.9 % — ABNORMAL LOW (ref 39.0–52.0)
Hemoglobin: 8.5 g/dL — ABNORMAL LOW (ref 13.0–17.0)
MCH: 18.6 pg — ABNORMAL LOW (ref 26.0–34.0)
MCHC: 29.4 g/dL — AB (ref 30.0–36.0)
MCV: 63.4 fL — AB (ref 78.0–100.0)
PLATELETS: 364 10*3/uL (ref 150–400)
RBC: 4.56 MIL/uL (ref 4.22–5.81)
RDW: 20.1 % — AB (ref 11.5–15.5)
WBC: 4.9 10*3/uL (ref 4.0–10.5)

## 2014-09-16 LAB — TISSUE TRANSGLUTAMINASE, IGG: TISSUE TRANSGLUT AB: 3 U/mL (ref <6)

## 2014-09-16 LAB — IRON AND TIBC
Iron: 27 ug/dL — ABNORMAL LOW (ref 42–165)
Saturation Ratios: 7 % — ABNORMAL LOW (ref 20–55)
TIBC: 395 ug/dL (ref 215–435)
UIBC: 368 ug/dL (ref 125–400)

## 2014-09-16 LAB — URINE CULTURE
Colony Count: NO GROWTH
Culture: NO GROWTH

## 2014-09-16 NOTE — Care Management Note (Signed)
    Page 1 of 1   09/16/2014     11:31:30 AM CARE MANAGEMENT NOTE 09/16/2014  Patient:  Roy Arellano, Roy Arellano   Account Number:  [id number]  Date Initiated:  09/16/2014  Documentation initiated by:  Sunday Spillers  Subjective/Objective Assessment:   24 yo male admitted with intussusception. PTA lived at home with grandmother.     Action/Plan:   Home when stable   Anticipated DC Date:  09/18/2014   Anticipated DC Plan:  [place]  CM consult      Choice offered to / List presented to:             Status of service:  Completed, signed off Medicare Important Message given?   (If response is "NO", the following Medicare IM given date fields will be blank) Date Medicare IM given:   Medicare IM given by:   Date Additional Medicare IM given:   Additional Medicare IM given by:    Discharge Disposition:  HOME/SELF CARE  Per UR Regulation:  Reviewed for med. necessity/level of care/duration of stay  If discussed at Hall of Stay Meetings, dates discussed:    Comments:

## 2014-09-16 NOTE — Progress Notes (Signed)
[facility] PROGRESS NOTE  Mali Diedrich SWF:[id number] DOB: [birth year] DOA: 09/15/2014 PCP: No PCP Per Patient  Assessment/Plan: 1. Intussusception. -Patient undergoing CT scanning of abdomen and pelvis at outside facility for further evaluation of nausea vomiting and abdominal pain. He was found to have 2 small areas of small bowel intussusception. -After placing him on bowel rest overnight, showing significant clinical improvement today. -Surgery recommended pulling NG tube and advancing diet to clears. -Continue supportive care -Anticipate discharge in next 24 hours if he continues to improve and is tolerating regular diet.  2.  Nausea, vomiting, abdominal pain -Likely secondary to intussusception -Improved after bowel rest overnight -Advancing diet today, we'll discontinue IV fluids encourage oral intake  3.  History of ulcerative colitis  -Stable  Code Status: Full code Family Communication: Family not present Disposition Plan: Anticipate discharge in the next 24 hours if he remains stable   Consultants:  General surgery   HPI/Subjective: Patient is a pleasant 24 year old gentleman with a past medical history of ulcerative colitis who was admitted to medicine service on 09/15/2014. He presented as a transfer from [place], having complaints of abdominal pain associated with nausea and vomiting. That facility was further worked up with a CT scan of abdomen and pelvis which showed 2 small bowel intussusceptions involving left upper quadrant, one measuring 4 cm and second measuring 2 cm. No high-grade obstruction was identified. He was made nothing by mouth as NG tube was placed. She was seen and evaluated by general surgery who recommended conservative management. By the following morning he reported significant improvement. The NG tube was discontinued as diet was advanced to clears.  Objective: Filed Vitals:   09/16/14 0930  BP: 134/74  Pulse: 63  Temp: 98.6  F (37 C)  Resp: 16    Intake/Output Summary (Last 24 hours) at 09/16/14 1148 Last data filed at 09/16/14 0900  Gross per 24 hour  Intake   3240 ml  Output   1350 ml  Net   1890 ml   Filed Weights   09/15/14 1039  Weight: 63.504 kg (140 lb)    Exam:   General:  Patient is in no acute distress, states feeling much better, improvement to symptoms  Cardiovascular: Regular rate and rhythm normal S1-S2 no murmurs rubs or gallops  Respiratory: Normal respiratory effort, lungs are clear to auscultation bilaterally  Abdomen: Soft, nontender nondistended positive bowel sounds  Musculoskeletal: No edema  Data Reviewed: Basic Metabolic Panel:  Recent Labs Lab 09/15/14 1155 09/15/14 2018 09/16/14 0535  NA 136  --  132*  K 3.7  --  3.7  CL 98  --  99  CO2 30  --  27  GLUCOSE 100*  --  99  BUN 14  --  9  CREATININE 0.79 0.74 0.81  CALCIUM 9.7  --  8.8   Liver Function Tests:  Recent Labs Lab 09/15/14 1155  AST 27  ALT 15  ALKPHOS 85  BILITOT 0.1*  PROT 9.0*  ALBUMIN 4.5    Recent Labs Lab 09/15/14 1155  LIPASE 18   No results for input(s): AMMONIA in the last 168 hours. CBC:  Recent Labs Lab 09/15/14 1155 09/15/14 2018 09/16/14 0535  WBC 6.1 5.9 4.9  NEUTROABS 4.1  --   --   HGB 9.3* 8.3* 8.5*  HCT 30.3* 28.7* 28.9*  MCV 61.2* 63.6* 63.4*  PLT 448* 372 364   Cardiac Enzymes: No results for input(s): CKTOTAL, CKMB, CKMBINDEX, TROPONINI in the last  168 hours. BNP (last 3 results) No results for input(s): BNP in the last 8760 hours.  ProBNP (last 3 results) No results for input(s): PROBNP in the last 8760 hours.  CBG: No results for input(s): GLUCAP in the last 168 hours.  No results found for this or any previous visit (from the past 240 hour(s)).   Studies: Dg Abd 1 View  09/16/2014   CLINICAL DATA:  Ulcerative colitis. Nausea, vomiting, right upper quadrant pain.  EXAM: ABDOMEN - 1 VIEW  COMPARISON:  CT 09/15/2014  FINDINGS: Gas and  contrast material noted within nondistended large bowel. No small bowel distention to suggest obstruction. No organomegaly or suspicious calcification. No acute bony abnormality.  IMPRESSION: No acute findings.   Electronically Signed   By: Rolm Baptise M.D.   On: 09/16/2014 08:59   Ct Abdomen Pelvis W Contrast  09/15/2014   CLINICAL DATA:  Right upper quadrant pain since Sunday. Nausea vomiting and diarrhea.  EXAM: CT ABDOMEN AND PELVIS WITH CONTRAST  TECHNIQUE: Multidetector CT imaging of the abdomen and pelvis was performed using the standard protocol following bolus administration of intravenous contrast.  CONTRAST:  67mL OMNIPAQUE IOHEXOL 300 MG/ML SOLN, 94mL OMNIPAQUE IOHEXOL 300 MG/ML SOLN, 139mL OMNIPAQUE IOHEXOL 300 MG/ML SOLN  COMPARISON:  None.  FINDINGS: Lower chest:  Lung bases are clear.  Hepatobiliary: No focal hepatic lesion. No duct dilatation. There is some thickening through the falciform ligament. Portal veins are patent. Gallbladder is normal.  Pancreas: Pancreas is normal. No ductal dilatation. No pancreatic inflammation.  Spleen: Normal spleen  Adrenals/urinary tract: Adrenal glands and kidneys are normal. Ureters and bladder normal.  Stomach/Bowel: Stomach and duodenum are normal. There is a small bowel small bowel intussusception in the left upper quadrant measuring 4 cm in length seen best on coronal image 33, series 5. There is no lead point mass identified. There is a second short small bowel intussusceptions in left upper quadrant measuring 2 cm on image 37, series 2. Patient did not drink much oral contrast therefore the progression of oral contrast is not well demonstrated. There may be mild small bowel dilatation proximal to the longer intussusception as seen on coronal image 32. The more distal small bowel is normal. The terminal ileum is grossly normal. The colon and rectosigmoid colon appear normal without evidence of acute inflammation. No facial or abscess.   Vascular/Lymphatic: Abdominal aorta is normal caliber. There is no retroperitoneal or periportal lymphadenopathy. No pelvic lymphadenopathy.  Reproductive: Prostate gland is normal.  Musculoskeletal: No aggressive osseous lesion. Sacroiliac joints are normal. Benign sclerotic lesion in the inferior right iliac bone.  IMPRESSION: 1. Two small bowel intussusceptions in the left upper quadrant; one measuring 4 cm and a second measuring 2 cm. No high-grade obstruction. No lead point mass identified. Typically intussusceptions of 4 cm less are transient. Recommend follow-up imaging to demonstrate resolution. If vomiting persists, surgical consultation may be warranted. 2. No evidence of active inflammatory bowel.   Electronically Signed   By: Suzy Bouchard M.D.   On: 09/15/2014 15:44    Scheduled Meds: . enoxaparin (LOVENOX) injection  40 mg Subcutaneous Q24H  . pantoprazole (PROTONIX) IV  40 mg Intravenous Q24H  . pneumococcal 23 valent vaccine  0.5 mL Intramuscular Tomorrow-1000   Continuous Infusions: . 0.9 % NaCl with KCl 20 mEq / L Stopped (09/16/14 0900)    Principal Problem:   Intussusception Active Problems:   Abdominal pain   Ulcerative colitis   Microcytic anemia   Thrombocytosis  Time spent: 25 minutes    Kelvin Cellar  [facility] Pager [phone], please contact night-coverage at [email or web address], password [facility] 09/16/2014, 11:48 AM  LOS: 1 day

## 2014-09-16 NOTE — Progress Notes (Signed)
Removed NGT per Erby Pian, NP.

## 2014-09-16 NOTE — Consult Note (Signed)
Reason for Consult: intussusception  Referring Physician: Dr. Margreta Journey Rama   HPI:  Roy Arellano is a 24 year old male with a history of ulcerative colitis diagnosed in 2010 presented from [facility] with intussusception.  The patient reports onset of symptoms was about 5 days ago.  Developed nausea, vomiting and abdominal pain.  Location of pain is in the RUQ.  Symptoms were severe.  Time pattern was intermittent.  Characterized as sharp pain that would dull and then subside only to return again.  Aggravated by oral intake.  No alleviating factors.  Modifying factors include; gatorade and rest.  He reports previous symptoms x2 in 2014 similar to this, but less in severity that resolved with eating liquids, rest and OTC analgesics.  He stopped vomiting on Tuesday night, but was "dry heaving" and due to persistent symptoms, he sought further work up.  He denies jelly like stools or hematochezia.  At present time, he reports mild symptoms.  He wants the NGT out as it is bothersome. At [facility], a CT of abdomen and pelvis revealed intussusception, electrolytes are normal.  H&H low at 8.5/28.9  The patient reports he has known this since September of 2015.  CBC from May 2014 with h&h 11.9/35.4.   He is followed by Dr. Sharyne Peach at [facility] for his [place].  Reports last colonoscopy was in 2010.    Past Medical History  Diagnosis Date  . Ulcerative colitis   . Bronchitis   . Seasonal allergies     History reviewed. No pertinent past surgical history.  Family History  Problem Relation Age of Onset  . Diabetes Maternal Grandmother   . Ulcers Maternal Grandmother   . Ulcerative colitis Maternal Uncle     Social History:  reports that he has been smoking.  He has never used smokeless tobacco. He reports that he does not drink alcohol or use illicit drugs.  Allergies: No Known Allergies  Medications: . No current facility-administered medications on file prior to encounter.   Current Outpatient Prescriptions on  File Prior to Encounter  Medication Sig Dispense Refill  . LIALDA 1.2 G EC tablet TAKE TWO TABLETS BY MOUTH DAILY MUST VISIT PCP FOR CONTINUED THERAPY 60 tablet 2  . benzonatate (TESSALON) 100 MG capsule Take 1 capsule (100 mg total) by mouth every 8 (eight) hours. 21 capsule 0  . cetirizine-pseudoephedrine (ZYRTEC-D) 5-120 MG per tablet Take 1 tablet by mouth 2 (two) times daily. 14 tablet 0  . guaiFENesin (MUCINEX) 600 MG 12 hr tablet Take 1 tablet (600 mg total) by mouth 2 (two) times daily. 14 tablet 0  . naproxen sodium (ALEVE) 220 MG tablet Take 2 tablets every 12 hours as needed for chest wall pain. Best taken with a meal.    . omeprazole (PRILOSEC) 20 MG capsule Take 1 capsule (20 mg total) by mouth daily. 30 capsule 0  . ondansetron (ZOFRAN) 4 MG tablet Take 1 tablet (4 mg total) by mouth every 8 (eight) hours as needed for nausea. 12 tablet 0  . sucralfate (CARAFATE) 1 GM/10ML suspension Take 10 mLs (1 g total) by mouth 4 (four) times daily -  with meals and at bedtime. 420 mL 0   Home Medication Lialda 1.2G EC tablet 2 tablets BID  Results for orders placed or performed during the hospital encounter of 09/15/14 (from the past 48 hour(s))  Urinalysis, Routine w reflex microscopic     Status: Abnormal   Collection Time: 09/15/14 10:40 AM  Result Value Ref Range  Color, Urine YELLOW YELLOW   APPearance TURBID (A) CLEAR   Specific Gravity, Urine 1.023 1.005 - 1.030   pH 7.5 5.0 - 8.0   Glucose, UA NEGATIVE NEGATIVE mg/dL   Hgb urine dipstick SMALL (A) NEGATIVE   Bilirubin Urine NEGATIVE NEGATIVE   Ketones, ur NEGATIVE NEGATIVE mg/dL   Protein, ur 100 (A) NEGATIVE mg/dL   Urobilinogen, UA 1.0 0.0 - 1.0 mg/dL   Nitrite NEGATIVE NEGATIVE   Leukocytes, UA NEGATIVE NEGATIVE  Urine microscopic-add on     Status: Abnormal   Collection Time: 09/15/14 10:40 AM  Result Value Ref Range   Squamous Epithelial / LPF FEW (A) RARE   WBC, UA 0-2 <3 WBC/hpf   RBC / HPF 11-20 <3 RBC/hpf    Bacteria, UA MANY (A) RARE   Urine-Other AMORPHOUS URATES/PHOSPHATES   Lipase, blood     Status: None   Collection Time: 09/15/14 11:55 AM  Result Value Ref Range   Lipase 18 11 - 59 U/L  Comprehensive metabolic panel     Status: Abnormal   Collection Time: 09/15/14 11:55 AM  Result Value Ref Range   Sodium 136 135 - 145 mmol/L   Potassium 3.7 3.5 - 5.1 mmol/L   Chloride 98 96 - 112 mmol/L   CO2 30 19 - 32 mmol/L   Glucose, Bld 100 (H) 70 - 99 mg/dL   BUN 14 6 - 23 mg/dL   Creatinine, Ser 0.79 0.50 - 1.35 mg/dL   Calcium 9.7 8.4 - 10.5 mg/dL   Total Protein 9.0 (H) 6.0 - 8.3 g/dL   Albumin 4.5 3.5 - 5.2 g/dL   AST 27 0 - 37 U/L   ALT 15 0 - 53 U/L   Alkaline Phosphatase 85 39 - 117 U/L   Total Bilirubin 0.1 (L) 0.3 - 1.2 mg/dL   GFR calc non Af Amer >90 >90 mL/min   GFR calc Af Amer >90 >90 mL/min    Comment: (NOTE) The eGFR has been calculated using the CKD EPI equation. This calculation has not been validated in all clinical situations. eGFR's persistently <90 mL/min signify possible Chronic Kidney Disease.    Anion gap 8 5 - 15  CBC with Differential/Platelet     Status: Abnormal   Collection Time: 09/15/14 11:55 AM  Result Value Ref Range   WBC 6.1 4.0 - 10.5 K/uL   RBC 4.95 4.22 - 5.81 MIL/uL   Hemoglobin 9.3 (L) 13.0 - 17.0 g/dL   HCT 30.3 (L) 39.0 - 52.0 %   MCV 61.2 (L) 78.0 - 100.0 fL   MCH 18.8 (L) 26.0 - 34.0 pg   MCHC 30.7 30.0 - 36.0 g/dL   RDW 21.9 (H) 11.5 - 15.5 %   Platelets 448 (H) 150 - 400 K/uL   Neutrophils Relative % 68 43 - 77 %   Lymphocytes Relative 19 12 - 46 %   Monocytes Relative 13 (H) 3 - 12 %   Eosinophils Relative 0 0 - 5 %   Basophils Relative 0 0 - 1 %   Neutro Abs 4.1 1.7 - 7.7 K/uL   Lymphs Abs 1.2 0.7 - 4.0 K/uL   Monocytes Absolute 0.8 0.1 - 1.0 K/uL   Eosinophils Absolute 0.0 0.0 - 0.7 K/uL   Basophils Absolute 0.0 0.0 - 0.1 K/uL   RBC Morphology ELLIPTOCYTES     Comment: TARGET CELLS SPHEROCYTES   CBC     Status:  Abnormal   Collection Time: 09/15/14  8:18 PM  Result  Value Ref Range   WBC 5.9 4.0 - 10.5 K/uL   RBC 4.51 4.22 - 5.81 MIL/uL   Hemoglobin 8.3 (L) 13.0 - 17.0 g/dL   HCT 28.7 (L) 39.0 - 52.0 %   MCV 63.6 (L) 78.0 - 100.0 fL   MCH 18.4 (L) 26.0 - 34.0 pg   MCHC 28.9 (L) 30.0 - 36.0 g/dL   RDW 20.0 (H) 11.5 - 15.5 %   Platelets 372 150 - 400 K/uL  Creatinine, serum     Status: None   Collection Time: 09/15/14  8:18 PM  Result Value Ref Range   Creatinine, Ser 0.74 0.50 - 1.35 mg/dL   GFR calc non Af Amer >90 >90 mL/min   GFR calc Af Amer >90 >90 mL/min    Comment: (NOTE) The eGFR has been calculated using the CKD EPI equation. This calculation has not been validated in all clinical situations. eGFR's persistently <90 mL/min signify possible Chronic Kidney Disease.   Iron and TIBC     Status: Abnormal   Collection Time: 09/15/14  8:18 PM  Result Value Ref Range   Iron 27 (L) 42 - 165 ug/dL   TIBC 395 215 - 435 ug/dL   Saturation Ratios 7 (L) 20 - 55 %   UIBC 368 125 - 400 ug/dL    Comment: Performed at [place] metabolic panel     Status: Abnormal   Collection Time: 09/16/14  5:35 AM  Result Value Ref Range   Sodium 132 (L) 135 - 145 mmol/L   Potassium 3.7 3.5 - 5.1 mmol/L   Chloride 99 96 - 112 mmol/L   CO2 27 19 - 32 mmol/L   Glucose, Bld 99 70 - 99 mg/dL   BUN 9 6 - 23 mg/dL   Creatinine, Ser 0.81 0.50 - 1.35 mg/dL   Calcium 8.8 8.4 - 10.5 mg/dL   GFR calc non Af Amer >90 >90 mL/min   GFR calc Af Amer >90 >90 mL/min    Comment: (NOTE) The eGFR has been calculated using the CKD EPI equation. This calculation has not been validated in all clinical situations. eGFR's persistently <90 mL/min signify possible Chronic Kidney Disease.    Anion gap 6 5 - 15  CBC     Status: Abnormal   Collection Time: 09/16/14  5:35 AM  Result Value Ref Range   WBC 4.9 4.0 - 10.5 K/uL   RBC 4.56 4.22 - 5.81 MIL/uL   Hemoglobin 8.5 (L) 13.0 - 17.0 g/dL   HCT 28.9  (L) 39.0 - 52.0 %   MCV 63.4 (L) 78.0 - 100.0 fL   MCH 18.6 (L) 26.0 - 34.0 pg   MCHC 29.4 (L) 30.0 - 36.0 g/dL   RDW 20.1 (H) 11.5 - 15.5 %   Platelets 364 150 - 400 K/uL    Ct Abdomen Pelvis W Contrast  09/15/2014   CLINICAL DATA:  Right upper quadrant pain since Sunday. Nausea vomiting and diarrhea.  EXAM: CT ABDOMEN AND PELVIS WITH CONTRAST  TECHNIQUE: Multidetector CT imaging of the abdomen and pelvis was performed using the standard protocol following bolus administration of intravenous contrast.  CONTRAST:  22m OMNIPAQUE IOHEXOL 300 MG/ML SOLN, 260mOMNIPAQUE IOHEXOL 300 MG/ML SOLN, 10076mMNIPAQUE IOHEXOL 300 MG/ML SOLN  COMPARISON:  None.  FINDINGS: Lower chest:  Lung bases are clear.  Hepatobiliary: No focal hepatic lesion. No duct dilatation. There is some thickening through the falciform ligament. Portal veins are patent. Gallbladder is normal.  Pancreas:  Pancreas is normal. No ductal dilatation. No pancreatic inflammation.  Spleen: Normal spleen  Adrenals/urinary tract: Adrenal glands and kidneys are normal. Ureters and bladder normal.  Stomach/Bowel: Stomach and duodenum are normal. There is a small bowel small bowel intussusception in the left upper quadrant measuring 4 cm in length seen best on coronal image 33, series 5. There is no lead point mass identified. There is a second short small bowel intussusceptions in left upper quadrant measuring 2 cm on image 37, series 2. Patient did not drink much oral contrast therefore the progression of oral contrast is not well demonstrated. There may be mild small bowel dilatation proximal to the longer intussusception as seen on coronal image 32. The more distal small bowel is normal. The terminal ileum is grossly normal. The colon and rectosigmoid colon appear normal without evidence of acute inflammation. No facial or abscess.  Vascular/Lymphatic: Abdominal aorta is normal caliber. There is no retroperitoneal or periportal lymphadenopathy. No  pelvic lymphadenopathy.  Reproductive: Prostate gland is normal.  Musculoskeletal: No aggressive osseous lesion. Sacroiliac joints are normal. Benign sclerotic lesion in the inferior right iliac bone.  IMPRESSION: 1. Two small bowel intussusceptions in the left upper quadrant; one measuring 4 cm and a second measuring 2 cm. No high-grade obstruction. No lead point mass identified. Typically intussusceptions of 4 cm less are transient. Recommend follow-up imaging to demonstrate resolution. If vomiting persists, surgical consultation may be warranted. 2. No evidence of active inflammatory bowel.   Electronically Signed   By: Suzy Bouchard M.D.   On: 09/15/2014 15:44    Review of Systems  All other systems reviewed and are negative.  Blood pressure 144/70, pulse 61, temperature 98.6 F (37 C), temperature source Oral, resp. rate 20, height 5' 10" (1.778 m), weight 63.504 kg (140 lb), SpO2 100 %. Physical Exam  Constitutional: He is oriented to person, place, and time. He appears well-developed and well-nourished. No distress.  Cardiovascular: Normal rate, regular rhythm, normal heart sounds and intact distal pulses.  Exam reveals no gallop and no friction rub.   No murmur heard. Respiratory: Effort normal and breath sounds normal. No respiratory distress. He has no wheezes. He has no rales. He exhibits no tenderness.  GI: Soft. Bowel sounds are normal. He exhibits no distension and no mass. There is no tenderness. There is no rebound and no guarding.  Musculoskeletal: Normal range of motion. He exhibits no edema or tenderness.  Neurological: He is alert and oriented to person, place, and time.  Skin: Skin is warm and dry. No rash noted. He is not diaphoretic. No erythema. No pallor.  Psychiatric: He has a normal mood and affect. His behavior is normal. Judgment and thought content normal.    Assessment/Plan: Hx Ulcerative Colitis Nausea/vomiting/abdominal pain, intussusception on CT -he has  significantly improved.  Will pull his NGT and give clears.  If able to tolerate, advance.  Should he have recurrent symptoms, UGI with SBFT -supportive care -i encouraged him to stop smoking  Microcytic anemia, chronic  -work up per primary team -followed by Dr. Sharyne Peach at [facility], [facility] ANP-BC 09/16/2014, 8:12 AM

## 2014-09-17 LAB — CBC
HCT: 30.7 % — ABNORMAL LOW (ref 39.0–52.0)
Hemoglobin: 9 g/dL — ABNORMAL LOW (ref 13.0–17.0)
MCH: 18.4 pg — ABNORMAL LOW (ref 26.0–34.0)
MCHC: 29.3 g/dL — AB (ref 30.0–36.0)
MCV: 62.8 fL — ABNORMAL LOW (ref 78.0–100.0)
Platelets: 435 10*3/uL — ABNORMAL HIGH (ref 150–400)
RBC: 4.89 MIL/uL (ref 4.22–5.81)
RDW: 19.8 % — AB (ref 11.5–15.5)
WBC: 6.4 10*3/uL (ref 4.0–10.5)

## 2014-09-17 LAB — BASIC METABOLIC PANEL
Anion gap: 7 (ref 5–15)
BUN: 13 mg/dL (ref 6–23)
CALCIUM: 9.2 mg/dL (ref 8.4–10.5)
CHLORIDE: 96 mmol/L (ref 96–112)
CO2: 30 mmol/L (ref 19–32)
CREATININE: 0.78 mg/dL (ref 0.50–1.35)
GFR calc Af Amer: >90 mL/min (ref >90)
GFR calc non Af Amer: >90 mL/min (ref >90)
Glucose, Bld: 92 mg/dL (ref 70–99)
Potassium: 3.6 mmol/L (ref 3.5–5.1)
Sodium: 133 mmol/L — ABNORMAL LOW (ref 135–145)

## 2014-09-17 NOTE — Progress Notes (Signed)
Patient ID: Roy Arellano, male   DOB: [birth year], 24 y.o.   MRN: [id number]     [place]      [address] [place], [place] [address]    Phone: [phone] FAX: [phone]     Subjective: No further pain.  Had a BM. No n/v.    Objective:  Vital signs:  Filed Vitals:   09/16/14 1225 09/16/14 1427 09/16/14 2151 09/17/14 0537  BP: 129/70 140/66 138/103 118/67  Pulse: 61 64 55 60  Temp: 98.3 F (36.8 C) 98.3 F (36.8 C) 98.2 F (36.8 C) 98.4 F (36.9 C)  TempSrc: Oral Oral Oral Oral  Resp: $Remo'20 16 16 18  'yNpSk$ Height:      Weight:      SpO2: 98% 100% 100% 100%       Intake/Output   Yesterday:  03/31 0701 - 04/01 0700 In: 240 [P.O.:240] Out: -  This shift:  Total I/O In: 360 [P.O.:360] Out: -    Physical Exam: General: Pt awake/alert/oriented x4 in no acute distress Abdomen: Soft.  Nondistended.  Non tender.  No evidence of peritonitis.  No incarcerated hernias.    Problem List:   Principal Problem:   Intussusception Active Problems:   Abdominal pain   Ulcerative colitis   Microcytic anemia   Thrombocytosis    Results:   Labs: Results for orders placed or performed during the hospital encounter of 09/15/14 (from the past 48 hour(s))  Lipase, blood     Status: None   Collection Time: 09/15/14 11:55 AM  Result Value Ref Range   Lipase 18 11 - 59 U/L  Comprehensive metabolic panel     Status: Abnormal   Collection Time: 09/15/14 11:55 AM  Result Value Ref Range   Sodium 136 135 - 145 mmol/L   Potassium 3.7 3.5 - 5.1 mmol/L   Chloride 98 96 - 112 mmol/L   CO2 30 19 - 32 mmol/L   Glucose, Bld 100 (H) 70 - 99 mg/dL   BUN 14 6 - 23 mg/dL   Creatinine, Ser 0.79 0.50 - 1.35 mg/dL   Calcium 9.7 8.4 - 10.5 mg/dL   Total Protein 9.0 (H) 6.0 - 8.3 g/dL   Albumin 4.5 3.5 - 5.2 g/dL   AST 27 0 - 37 U/L   ALT 15 0 - 53 U/L   Alkaline Phosphatase 85 39 - 117 U/L   Total Bilirubin 0.1 (L) 0.3 - 1.2 mg/dL   GFR  calc non Af Amer >90 >90 mL/min   GFR calc Af Amer >90 >90 mL/min    Comment: (NOTE) The eGFR has been calculated using the CKD EPI equation. This calculation has not been validated in all clinical situations. eGFR's persistently <90 mL/min signify possible Chronic Kidney Disease.    Anion gap 8 5 - 15  CBC with Differential/Platelet     Status: Abnormal   Collection Time: 09/15/14 11:55 AM  Result Value Ref Range   WBC 6.1 4.0 - 10.5 K/uL   RBC 4.95 4.22 - 5.81 MIL/uL   Hemoglobin 9.3 (L) 13.0 - 17.0 g/dL   HCT 30.3 (L) 39.0 - 52.0 %   MCV 61.2 (L) 78.0 - 100.0 fL   MCH 18.8 (L) 26.0 - 34.0 pg   MCHC 30.7 30.0 - 36.0 g/dL   RDW 21.9 (H) 11.5 - 15.5 %   Platelets 448 (H) 150 - 400 K/uL   Neutrophils Relative % 68 43 - 77 %  Lymphocytes Relative 19 12 - 46 %   Monocytes Relative 13 (H) 3 - 12 %   Eosinophils Relative 0 0 - 5 %   Basophils Relative 0 0 - 1 %   Neutro Abs 4.1 1.7 - 7.7 K/uL   Lymphs Abs 1.2 0.7 - 4.0 K/uL   Monocytes Absolute 0.8 0.1 - 1.0 K/uL   Eosinophils Absolute 0.0 0.0 - 0.7 K/uL   Basophils Absolute 0.0 0.0 - 0.1 K/uL   RBC Morphology ELLIPTOCYTES     Comment: TARGET CELLS SPHEROCYTES   CBC     Status: Abnormal   Collection Time: 09/15/14  8:18 PM  Result Value Ref Range   WBC 5.9 4.0 - 10.5 K/uL   RBC 4.51 4.22 - 5.81 MIL/uL   Hemoglobin 8.3 (L) 13.0 - 17.0 g/dL   HCT 28.7 (L) 39.0 - 52.0 %   MCV 63.6 (L) 78.0 - 100.0 fL   MCH 18.4 (L) 26.0 - 34.0 pg   MCHC 28.9 (L) 30.0 - 36.0 g/dL   RDW 20.0 (H) 11.5 - 15.5 %   Platelets 372 150 - 400 K/uL  Creatinine, serum     Status: None   Collection Time: 09/15/14  8:18 PM  Result Value Ref Range   Creatinine, Ser 0.74 0.50 - 1.35 mg/dL   GFR calc non Af Amer >90 >90 mL/min   GFR calc Af Amer >90 >90 mL/min    Comment: (NOTE) The eGFR has been calculated using the CKD EPI equation. This calculation has not been validated in all clinical situations. eGFR's persistently <90 mL/min signify possible  Chronic Kidney Disease.   Ferritin     Status: Abnormal   Collection Time: 09/15/14  8:18 PM  Result Value Ref Range   Ferritin 5 (L) 22 - 322 ng/mL    Comment: Performed at [facility]  Iron and TIBC     Status: Abnormal   Collection Time: 09/15/14  8:18 PM  Result Value Ref Range   Iron 27 (L) 42 - 165 ug/dL   TIBC 395 215 - 435 ug/dL   Saturation Ratios 7 (L) 20 - 55 %   UIBC 368 125 - 400 ug/dL    Comment: Performed at [facility] transglutaminase, IgG     Status: None   Collection Time: 09/15/14  8:18 PM  Result Value Ref Range   Tissue Transglut Ab 3 <6 U/mL    Comment: (NOTE) Value Interpretation:  <6:   Antibody Not Detected >=6:   Antibody Detected Performed at [facility]   Basic metabolic panel     Status: Abnormal   Collection Time: 09/16/14  5:35 AM  Result Value Ref Range   Sodium 132 (L) 135 - 145 mmol/L   Potassium 3.7 3.5 - 5.1 mmol/L   Chloride 99 96 - 112 mmol/L   CO2 27 19 - 32 mmol/L   Glucose, Bld 99 70 - 99 mg/dL   BUN 9 6 - 23 mg/dL   Creatinine, Ser 0.81 0.50 - 1.35 mg/dL   Calcium 8.8 8.4 - 10.5 mg/dL   GFR calc non Af Amer >90 >90 mL/min   GFR calc Af Amer >90 >90 mL/min    Comment: (NOTE) The eGFR has been calculated using the CKD EPI equation. This calculation has not been validated in all clinical situations. eGFR's persistently <90 mL/min signify possible Chronic Kidney Disease.    Anion gap 6 5 - 15  CBC     Status:  Abnormal   Collection Time: 09/16/14  5:35 AM  Result Value Ref Range   WBC 4.9 4.0 - 10.5 K/uL   RBC 4.56 4.22 - 5.81 MIL/uL   Hemoglobin 8.5 (L) 13.0 - 17.0 g/dL   HCT 28.9 (L) 39.0 - 52.0 %   MCV 63.4 (L) 78.0 - 100.0 fL   MCH 18.6 (L) 26.0 - 34.0 pg   MCHC 29.4 (L) 30.0 - 36.0 g/dL   RDW 20.1 (H) 11.5 - 15.5 %   Platelets 364 150 - 400 K/uL  Basic metabolic panel     Status: Abnormal   Collection Time: 09/17/14  5:40 AM  Result Value Ref Range   Sodium 133 (L) 135 - 145  mmol/L   Potassium 3.6 3.5 - 5.1 mmol/L   Chloride 96 96 - 112 mmol/L   CO2 30 19 - 32 mmol/L   Glucose, Bld 92 70 - 99 mg/dL   BUN 13 6 - 23 mg/dL   Creatinine, Ser 0.78 0.50 - 1.35 mg/dL   Calcium 9.2 8.4 - 10.5 mg/dL   GFR calc non Af Amer >90 >90 mL/min   GFR calc Af Amer >90 >90 mL/min    Comment: (NOTE) The eGFR has been calculated using the CKD EPI equation. This calculation has not been validated in all clinical situations. eGFR's persistently <90 mL/min signify possible Chronic Kidney Disease.    Anion gap 7 5 - 15  CBC     Status: Abnormal   Collection Time: 09/17/14  5:40 AM  Result Value Ref Range   WBC 6.4 4.0 - 10.5 K/uL   RBC 4.89 4.22 - 5.81 MIL/uL   Hemoglobin 9.0 (L) 13.0 - 17.0 g/dL   HCT 30.7 (L) 39.0 - 52.0 %   MCV 62.8 (L) 78.0 - 100.0 fL   MCH 18.4 (L) 26.0 - 34.0 pg   MCHC 29.3 (L) 30.0 - 36.0 g/dL   RDW 19.8 (H) 11.5 - 15.5 %   Platelets 435 (H) 150 - 400 K/uL    Imaging / Studies: Dg Abd 1 View  09/16/2014   CLINICAL DATA:  Ulcerative colitis. Nausea, vomiting, right upper quadrant pain.  EXAM: ABDOMEN - 1 VIEW  COMPARISON:  CT 09/15/2014  FINDINGS: Gas and contrast material noted within nondistended large bowel. No small bowel distention to suggest obstruction. No organomegaly or suspicious calcification. No acute bony abnormality.  IMPRESSION: No acute findings.   Electronically Signed   By: Rolm Baptise M.D.   On: 09/16/2014 08:59   Ct Abdomen Pelvis W Contrast  09/15/2014   CLINICAL DATA:  Right upper quadrant pain since Sunday. Nausea vomiting and diarrhea.  EXAM: CT ABDOMEN AND PELVIS WITH CONTRAST  TECHNIQUE: Multidetector CT imaging of the abdomen and pelvis was performed using the standard protocol following bolus administration of intravenous contrast.  CONTRAST:  66mL OMNIPAQUE IOHEXOL 300 MG/ML SOLN, 75mL OMNIPAQUE IOHEXOL 300 MG/ML SOLN, 158mL OMNIPAQUE IOHEXOL 300 MG/ML SOLN  COMPARISON:  None.  FINDINGS: Lower chest:  Lung bases are clear.   Hepatobiliary: No focal hepatic lesion. No duct dilatation. There is some thickening through the falciform ligament. Portal veins are patent. Gallbladder is normal.  Pancreas: Pancreas is normal. No ductal dilatation. No pancreatic inflammation.  Spleen: Normal spleen  Adrenals/urinary tract: Adrenal glands and kidneys are normal. Ureters and bladder normal.  Stomach/Bowel: Stomach and duodenum are normal. There is a small bowel small bowel intussusception in the left upper quadrant measuring 4 cm in length seen best on coronal image  33, series 5. There is no lead point mass identified. There is a second short small bowel intussusceptions in left upper quadrant measuring 2 cm on image 37, series 2. Patient did not drink much oral contrast therefore the progression of oral contrast is not well demonstrated. There may be mild small bowel dilatation proximal to the longer intussusception as seen on coronal image 32. The more distal small bowel is normal. The terminal ileum is grossly normal. The colon and rectosigmoid colon appear normal without evidence of acute inflammation. No facial or abscess.  Vascular/Lymphatic: Abdominal aorta is normal caliber. There is no retroperitoneal or periportal lymphadenopathy. No pelvic lymphadenopathy.  Reproductive: Prostate gland is normal.  Musculoskeletal: No aggressive osseous lesion. Sacroiliac joints are normal. Benign sclerotic lesion in the inferior right iliac bone.  IMPRESSION: 1. Two small bowel intussusceptions in the left upper quadrant; one measuring 4 cm and a second measuring 2 cm. No high-grade obstruction. No lead point mass identified. Typically intussusceptions of 4 cm less are transient. Recommend follow-up imaging to demonstrate resolution. If vomiting persists, surgical consultation may be warranted. 2. No evidence of active inflammatory bowel.   Electronically Signed   By: Suzy Bouchard M.D.   On: 09/15/2014 15:44    Medications / Allergies:  Scheduled  Meds: . enoxaparin (LOVENOX) injection  40 mg Subcutaneous Q24H  . pantoprazole (PROTONIX) IV  40 mg Intravenous Q24H  . pneumococcal 23 valent vaccine  0.5 mL Intramuscular Tomorrow-1000   Continuous Infusions: . 0.9 % NaCl with KCl 20 mEq / L Stopped (09/16/14 0900)   PRN Meds:.acetaminophen **OR** acetaminophen, morphine injection, ondansetron **OR** ondansetron (ZOFRAN) IV  Antibiotics: Anti-infectives    None        Assessment/Plan Hx Ulcerative Colitis Nausea/vomiting/abdominal pain, intussusception on CT Resolved.  Tolerating POs and having BMs.  Stable for DC from surgical standpoint.    Erby Pian, [facility] Surgery Pager [phone]([id number])   09/17/2014 10:46 AM

## 2014-09-17 NOTE — Discharge Summary (Addendum)
Physician Discharge Summary  Roy Arellano DZH:[id number] DOB: Apr 07, 1991 DOA: 09/15/2014  PCP: No PCP Per Patient  Admit date: 09/15/2014 Discharge date: 09/17/2014  Time spent: 35 minutes  Recommendations for Outpatient Follow-up:  1. Please follow up on abdominal symptoms, by the day of discharge he was tolerating PO intake.   Discharge Diagnoses:  Principal Problem:   Intussusception Active Problems:   Abdominal pain   Ulcerative colitis   Microcytic anemia   Thrombocytosis   Discharge Condition: Stable  Diet recommendation: Regular  Filed Weights   09/15/14 1039  Weight: 63.504 kg (140 lb)    History of present illness:   Roy Arellano is an 24 y.o. male with a PMH of ulcerative colitis, diagnosed approximately 5 years ago, who presented [facility] with a 4 day history of nausea, vomiting and right upper quadrant abdominal pain. Abdominal pain is crampy, sharp, intermittent and is rated 9/10 at its worst. Currently reports the pain is a level 6-7, and is "much better" than it has been over the past 48 hours. No associated diarrhea. Last BM was 5 days ago. Upon initial evaluation in the ED, a CT scan showed findings concerning for intussusception. The patient was subsequently referred to [facility] for inpatient management after the case was discussed with Dr. Leighton Ruff.  Hospital Course:  Patient is a pleasant 24 year old gentleman with a past medical history of ulcerative colitis who was admitted to medicine service on 09/15/2014. He presented as a transfer from [place], having complaints of abdominal pain associated with nausea and vomiting. That facility was further worked up with a CT scan of abdomen and pelvis which showed 2 small bowel intussusceptions involving left upper quadrant, one measuring 4 cm and second measuring 2 cm. No high-grade obstruction was identified. He was made nothing by mouth as NG tube was placed. She was seen and evaluated by general surgery  who recommended conservative management. By the following morning he reported significant improvement. The NG tube was discontinued on 09/16/2014 as diet was advanced to clears. He did well with further advancement of his diet. He was discharged to his home in stable condition on 09/17/2014.   Consultations:  General Surgery  Discharge Exam: Filed Vitals:   09/17/14 0537  BP: 118/67  Pulse: 60  Temp: 98.4 F (36.9 C)  Resp: 18    General: Patient is in no acute distress, states feeling much better, improvement to symptoms  Cardiovascular: Regular rate and rhythm normal S1-S2 no murmurs rubs or gallops  Respiratory: Normal respiratory effort, lungs are clear to auscultation bilaterally  Abdomen: Soft, nontender nondistended positive bowel sounds  Musculoskeletal: No edema  Discharge Instructions   Discharge Instructions    Call MD for:  difficulty breathing, headache or visual disturbances    Complete by:  As directed      Call MD for:  extreme fatigue    Complete by:  As directed      Call MD for:  hives    Complete by:  As directed      Call MD for:  persistant dizziness or light-headedness    Complete by:  As directed      Call MD for:  persistant nausea and vomiting    Complete by:  As directed      Call MD for:  redness, tenderness, or signs of infection (pain, swelling, redness, odor or green/yellow discharge around incision site)    Complete by:  As directed      Call  MD for:  severe uncontrolled pain    Complete by:  As directed      Call MD for:  temperature >100.4    Complete by:  As directed      Diet - low sodium heart healthy    Complete by:  As directed      Diet - low sodium heart healthy    Complete by:  As directed      Discharge instructions    Complete by:  As directed   Follow up with your GI Physician Dr Sharyne Peach at [facility] early next week     Increase activity slowly    Complete by:  As directed      Increase activity slowly    Complete by:   As directed            No Known Allergies    The results of significant diagnostics from this hospitalization (including imaging, microbiology, ancillary and laboratory) are listed below for reference.    Significant Diagnostic Studies: Dg Abd 1 View  09/16/2014   CLINICAL DATA:  Ulcerative colitis. Nausea, vomiting, right upper quadrant pain.  EXAM: ABDOMEN - 1 VIEW  COMPARISON:  CT 09/15/2014  FINDINGS: Gas and contrast material noted within nondistended large bowel. No small bowel distention to suggest obstruction. No organomegaly or suspicious calcification. No acute bony abnormality.  IMPRESSION: No acute findings.   Electronically Signed   By: Rolm Baptise M.D.   On: 09/16/2014 08:59   Ct Abdomen Pelvis W Contrast  09/15/2014   CLINICAL DATA:  Right upper quadrant pain since Sunday. Nausea vomiting and diarrhea.  EXAM: CT ABDOMEN AND PELVIS WITH CONTRAST  TECHNIQUE: Multidetector CT imaging of the abdomen and pelvis was performed using the standard protocol following bolus administration of intravenous contrast.  CONTRAST:  52mL OMNIPAQUE IOHEXOL 300 MG/ML SOLN, 54mL OMNIPAQUE IOHEXOL 300 MG/ML SOLN, 145mL OMNIPAQUE IOHEXOL 300 MG/ML SOLN  COMPARISON:  None.  FINDINGS: Lower chest:  Lung bases are clear.  Hepatobiliary: No focal hepatic lesion. No duct dilatation. There is some thickening through the falciform ligament. Portal veins are patent. Gallbladder is normal.  Pancreas: Pancreas is normal. No ductal dilatation. No pancreatic inflammation.  Spleen: Normal spleen  Adrenals/urinary tract: Adrenal glands and kidneys are normal. Ureters and bladder normal.  Stomach/Bowel: Stomach and duodenum are normal. There is a small bowel small bowel intussusception in the left upper quadrant measuring 4 cm in length seen best on coronal image 33, series 5. There is no lead point mass identified. There is a second short small bowel intussusceptions in left upper quadrant measuring 2 cm on image 37,  series 2. Patient did not drink much oral contrast therefore the progression of oral contrast is not well demonstrated. There may be mild small bowel dilatation proximal to the longer intussusception as seen on coronal image 32. The more distal small bowel is normal. The terminal ileum is grossly normal. The colon and rectosigmoid colon appear normal without evidence of acute inflammation. No facial or abscess.  Vascular/Lymphatic: Abdominal aorta is normal caliber. There is no retroperitoneal or periportal lymphadenopathy. No pelvic lymphadenopathy.  Reproductive: Prostate gland is normal.  Musculoskeletal: No aggressive osseous lesion. Sacroiliac joints are normal. Benign sclerotic lesion in the inferior right iliac bone.  IMPRESSION: 1. Two small bowel intussusceptions in the left upper quadrant; one measuring 4 cm and a second measuring 2 cm. No high-grade obstruction. No lead point mass identified. Typically intussusceptions of 4 cm less are transient. Recommend follow-up  imaging to demonstrate resolution. If vomiting persists, surgical consultation may be warranted. 2. No evidence of active inflammatory bowel.   Electronically Signed   By: Suzy Bouchard M.D.   On: 09/15/2014 15:44    Microbiology: Recent Results (from the past 240 hour(s))  Urine culture     Status: None   Collection Time: 09/15/14 10:40 AM  Result Value Ref Range Status   Specimen Description URINE, CLEAN CATCH  Final   Special Requests NONE  Final   Colony Count NO GROWTH Performed at [facility]   Final   Culture NO GROWTH Performed at [facility]   Final   Report Status 09/16/2014 FINAL  Final     Labs: Basic Metabolic Panel:  Recent Labs Lab 09/15/14 1155 09/15/14 2018 09/16/14 0535 09/17/14 0540  NA 136  --  132* 133*  K 3.7  --  3.7 3.6  CL 98  --  99 96  CO2 30  --  27 30  GLUCOSE 100*  --  99 92  BUN 14  --  9 13  CREATININE 0.79 0.74 0.81 0.78  CALCIUM 9.7  --  8.8 9.2   Liver  Function Tests:  Recent Labs Lab 09/15/14 1155  AST 27  ALT 15  ALKPHOS 85  BILITOT 0.1*  PROT 9.0*  ALBUMIN 4.5    Recent Labs Lab 09/15/14 1155  LIPASE 18   No results for input(s): AMMONIA in the last 168 hours. CBC:  Recent Labs Lab 09/15/14 1155 09/15/14 2018 09/16/14 0535 09/17/14 0540  WBC 6.1 5.9 4.9 6.4  NEUTROABS 4.1  --   --   --   HGB 9.3* 8.3* 8.5* 9.0*  HCT 30.3* 28.7* 28.9* 30.7*  MCV 61.2* 63.6* 63.4* 62.8*  PLT 448* 372 364 435*   Cardiac Enzymes: No results for input(s): CKTOTAL, CKMB, CKMBINDEX, TROPONINI in the last 168 hours. BNP: BNP (last 3 results) No results for input(s): BNP in the last 8760 hours.  ProBNP (last 3 results) No results for input(s): PROBNP in the last 8760 hours.  CBG: No results for input(s): GLUCAP in the last 168 hours.     SignedKelvin Cellar  [facility] 09/17/2014, 9:43 AM

## 2015-11-17 IMAGING — CT CT ABD-PELV W/ CM
2 of 4 series · 15 of 46 positions shown, 17 images · IV contrast (APPLIED)
Comparison: None.

CLINICAL DATA: Right upper quadrant pain since [REDACTED]. Nausea
vomiting and diarrhea.

EXAM:
CT ABDOMEN AND PELVIS WITH CONTRAST
TECHNIQUE: Multidetector CT imaging of the abdomen and pelvis was performed
using the standard protocol following bolus administration of
intravenous contrast.
CONTRAST:  25mL OMNIPAQUE IOHEXOL 300 MG/ML SOLN, 25mL OMNIPAQUE
IOHEXOL 300 MG/ML SOLN, 100mL OMNIPAQUE IOHEXOL 300 MG/ML SOLN

[Series 2: abd/pelvis 5.0 b31f · axial · 0.68mm/px · z∈[-452,-37]mm · 12 of 99 slices shown, 14 images]
[im 8/99  soft-tissue]
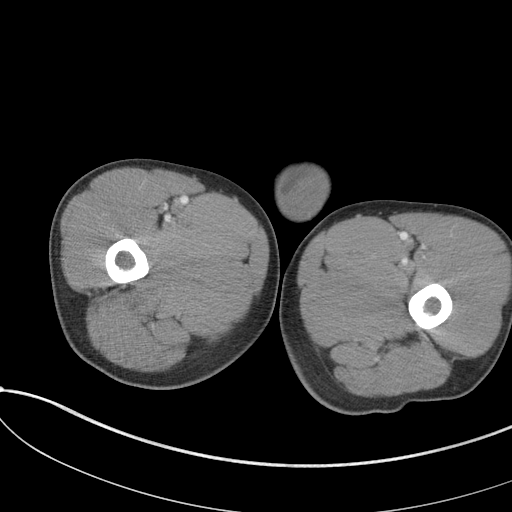
[im 8/99  bone]
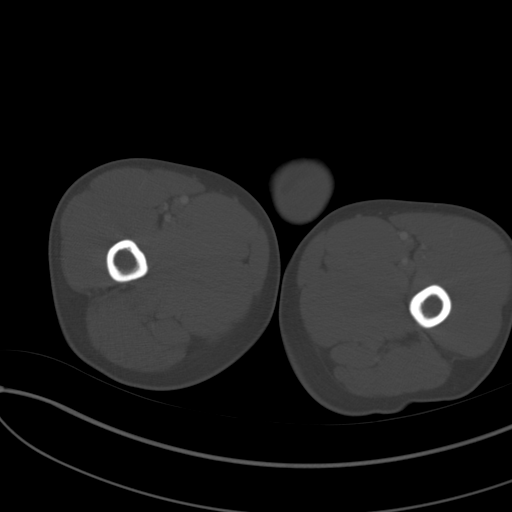
[im 16/99  soft-tissue]
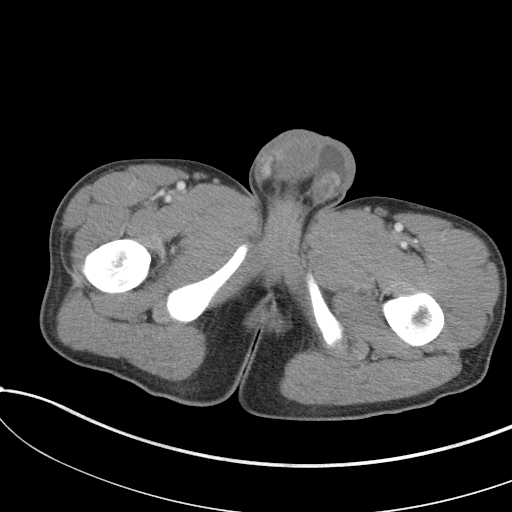
[im 23/99  soft-tissue]
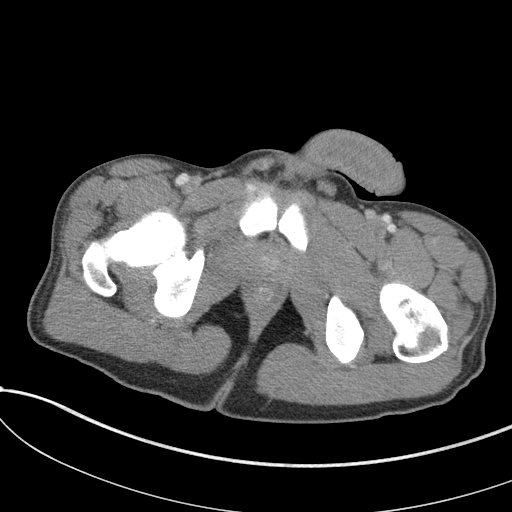
[im 31/99  soft-tissue]
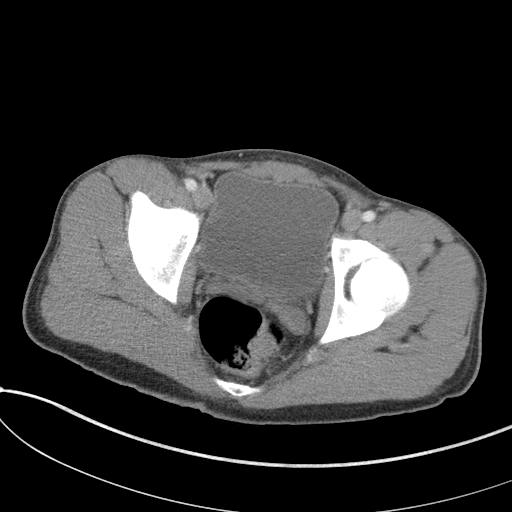
[im 38/99  soft-tissue]
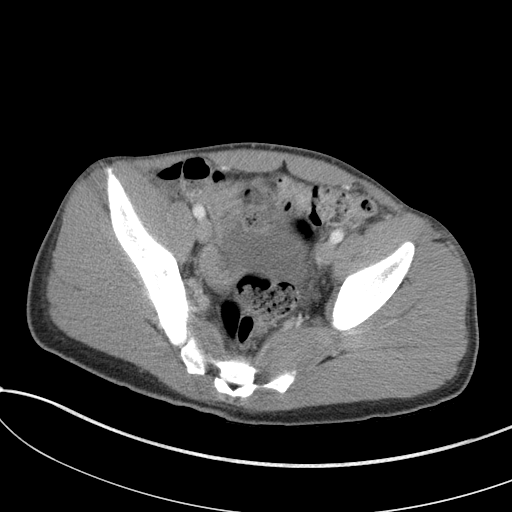
[im 46/99  soft-tissue]
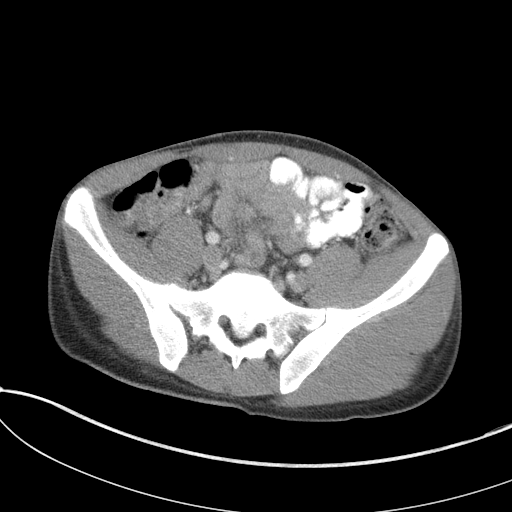
[im 53/99  soft-tissue]
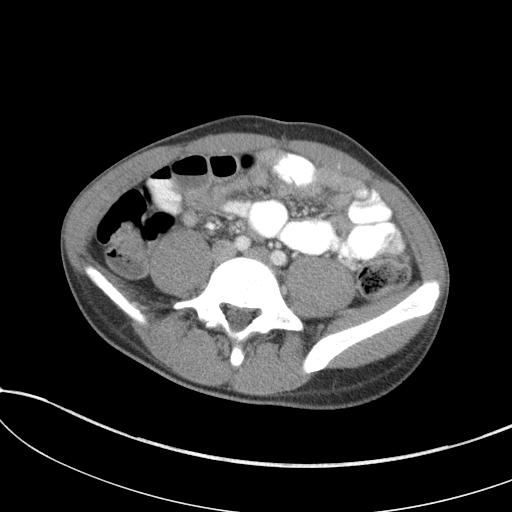
[im 61/99  soft-tissue]
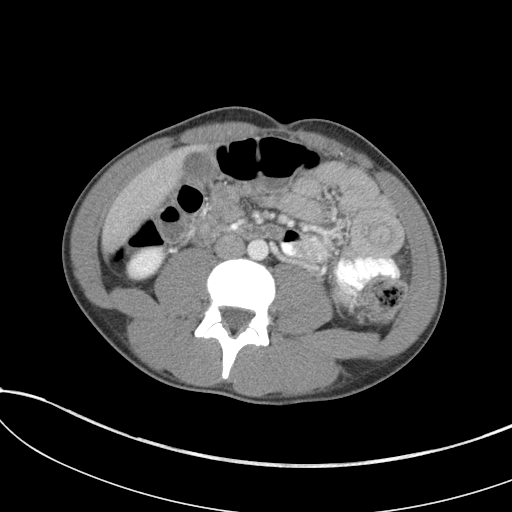
[im 68/99  soft-tissue]
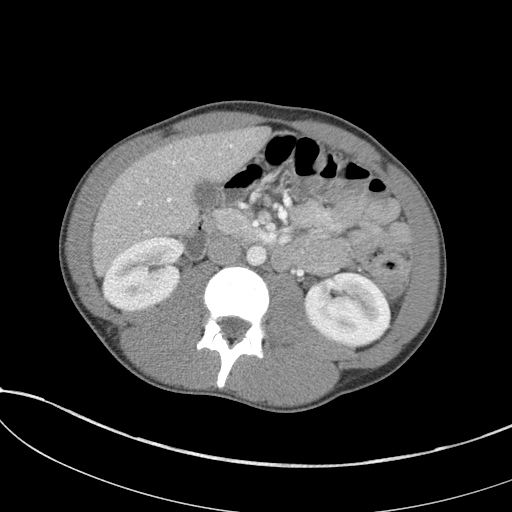
[im 68/99  bone]
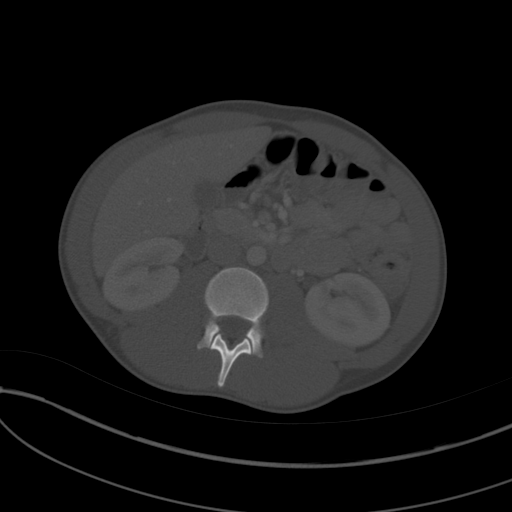
[im 76/99  soft-tissue]
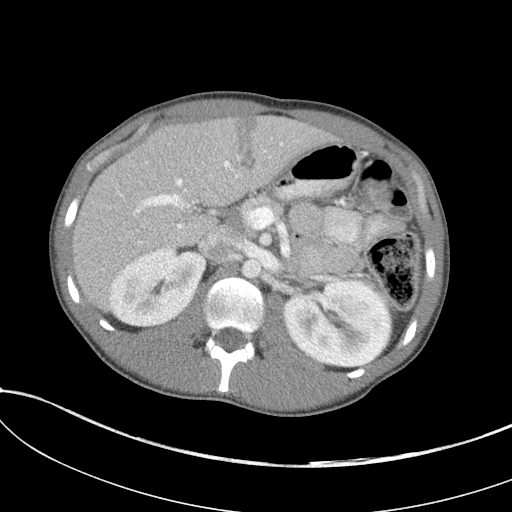
[im 83/99  soft-tissue]
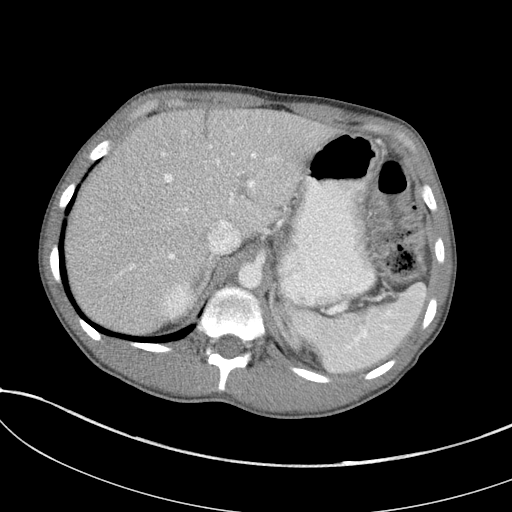
[im 91/99  soft-tissue]
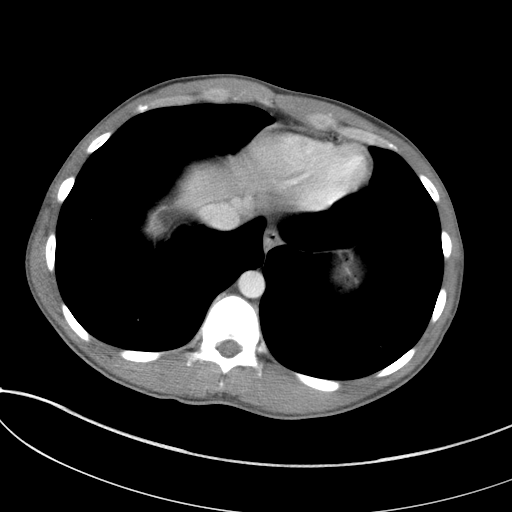

[Series 5: abd/pelvis 3.0 coronal · coronal · 0.74mm/px · 3 of 76 slices shown]
[im 26/76  soft-tissue]
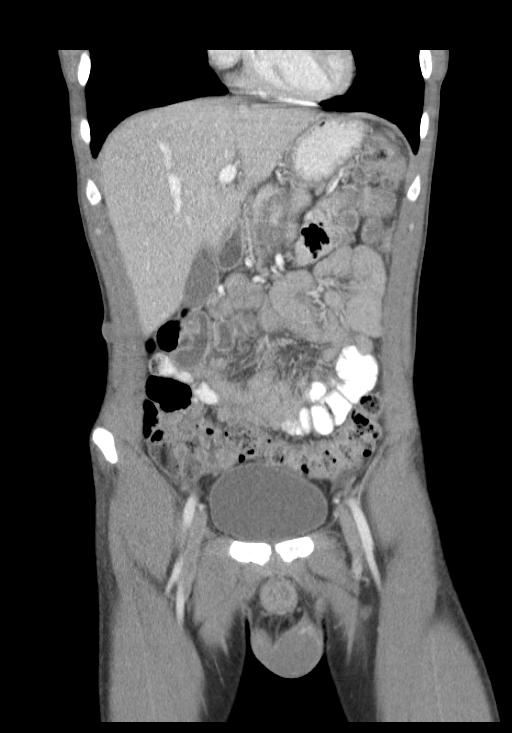
[im 34/76  soft-tissue]
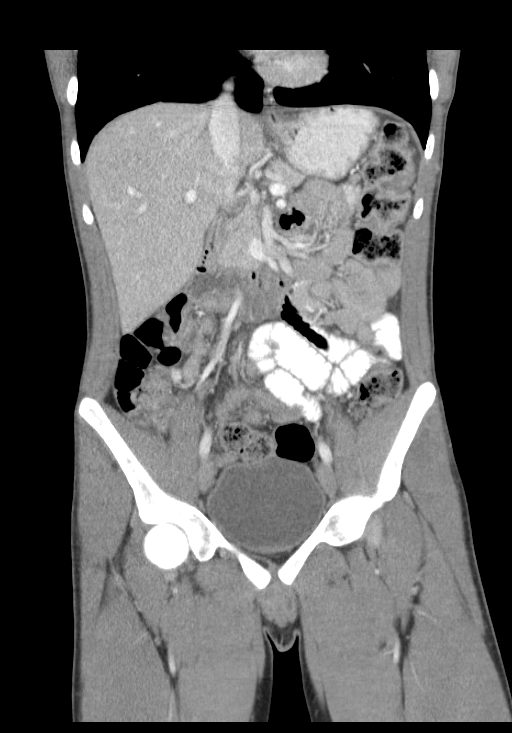
[im 42/76  soft-tissue]
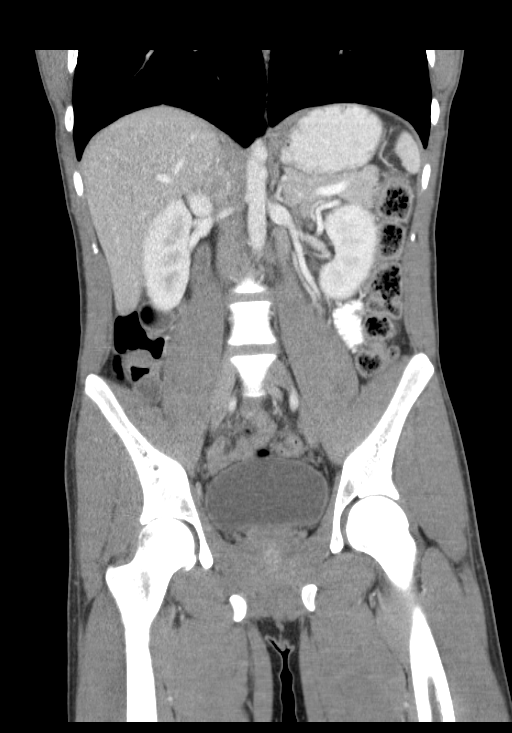

[15 of 46 positions shown; findings below may reference images not displayed]

FINDINGS: Lower chest:  Lung bases are clear.

Hepatobiliary: No focal hepatic lesion. No duct dilatation. There is
some thickening through the falciform ligament. Portal veins are
patent. Gallbladder is normal.

Pancreas: Pancreas is normal. No ductal dilatation. No pancreatic
inflammation.

Spleen: Normal spleen

Adrenals/urinary tract: Adrenal glands and kidneys are normal.
Ureters and bladder normal.

Stomach/Bowel: Stomach and duodenum are normal. There is a small
bowel small bowel intussusception in the left upper quadrant
measuring 4 cm in length seen best on coronal image 33, series 5.
There is no lead point mass identified. There is a second short
small bowel intussusceptions in left upper quadrant measuring 2 cm
on image 37, series 2. Patient did not drink much oral contrast
therefore the progression of oral contrast is not well demonstrated.
There may be mild small bowel dilatation proximal to the longer
intussusception as seen on coronal image 32. The more distal small
bowel is normal. The terminal ileum is grossly normal. The colon and
rectosigmoid colon appear normal without evidence of acute
inflammation. No facial or abscess.

Vascular/Lymphatic: Abdominal aorta is normal caliber. There is no
retroperitoneal or periportal lymphadenopathy. No pelvic
lymphadenopathy.

Reproductive: Prostate gland is normal.

Musculoskeletal: No aggressive osseous lesion. Sacroiliac joints are
normal. Benign sclerotic lesion in the inferior right iliac bone.
IMPRESSION: 1. Two small bowel intussusceptions in the left upper quadrant; one
measuring 4 cm and a second measuring 2 cm. No high-grade
obstruction. No lead point mass identified. Typically
intussusceptions of 4 cm less are transient. Recommend follow-up
imaging to demonstrate resolution. If vomiting persists, surgical
consultation may be warranted.
2. No evidence of active inflammatory bowel.

## 2017-02-20 ENCOUNTER — Encounter (HOSPITAL_BASED_OUTPATIENT_CLINIC_OR_DEPARTMENT_OTHER): Payer: Self-pay | Admitting: *Deleted

## 2017-02-20 DIAGNOSIS — R112 Nausea with vomiting, unspecified: Secondary | ICD-10-CM | POA: Insufficient documentation

## 2017-02-20 DIAGNOSIS — R1084 Generalized abdominal pain: Secondary | ICD-10-CM | POA: Insufficient documentation

## 2017-02-20 DIAGNOSIS — F17200 Nicotine dependence, unspecified, uncomplicated: Secondary | ICD-10-CM | POA: Insufficient documentation

## 2017-02-20 DIAGNOSIS — Z79899 Other long term (current) drug therapy: Secondary | ICD-10-CM | POA: Insufficient documentation

## 2017-02-20 MED ORDER — ONDANSETRON 4 MG PO TBDP
4.0000 mg | ORAL_TABLET | Freq: Once | ORAL | Status: AC
  Administered 2017-02-20: 4 mg via ORAL

## 2017-02-20 MED ORDER — ONDANSETRON 4 MG PO TBDP
ORAL_TABLET | ORAL | Status: AC
  Filled 2017-02-20: qty 1

## 2017-02-20 NOTE — ED Triage Notes (Signed)
Pt c/o vomiting x 2 days , seen today  [place] ED yesterday for same. Pt didn't get Zofran filled.

## 2017-02-21 ENCOUNTER — Emergency Department (HOSPITAL_BASED_OUTPATIENT_CLINIC_OR_DEPARTMENT_OTHER)
Admission: EM | Admit: 2017-02-21 | Discharge: 2017-02-21 | Disposition: A | Discharge status: Home / Self Care | Payer: 59 | Attending: Emergency Medicine | Admitting: Emergency Medicine

## 2017-02-21 DIAGNOSIS — R1084 Generalized abdominal pain: Secondary | ICD-10-CM

## 2017-02-21 DIAGNOSIS — R1115 Cyclical vomiting syndrome unrelated to migraine: Secondary | ICD-10-CM

## 2017-02-21 LAB — BASIC METABOLIC PANEL
Anion gap: 9 (ref 5–15)
BUN: 15 mg/dL (ref 6–20)
CALCIUM: 9.2 mg/dL (ref 8.9–10.3)
CO2: 29 mmol/L (ref 22–32)
Chloride: 99 mmol/L — ABNORMAL LOW (ref 101–111)
Creatinine, Ser: 0.99 mg/dL (ref 0.61–1.24)
GFR calc Af Amer: >60 mL/min (ref >60)
GFR calc non Af Amer: >60 mL/min (ref >60)
GLUCOSE: 105 mg/dL — AB (ref 65–99)
Potassium: 3 mmol/L — ABNORMAL LOW (ref 3.5–5.1)
Sodium: 137 mmol/L (ref 135–145)

## 2017-02-21 LAB — CBC WITH DIFFERENTIAL/PLATELET
Basophils Absolute: 0 10*3/uL (ref 0.0–0.1)
Basophils Relative: 0 %
EOS PCT: 1 %
Eosinophils Absolute: 0.1 10*3/uL (ref 0.0–0.7)
HCT: 36.7 % — ABNORMAL LOW (ref 39.0–52.0)
Hemoglobin: 12.2 g/dL — ABNORMAL LOW (ref 13.0–17.0)
LYMPHS ABS: 1 10*3/uL (ref 0.7–4.0)
Lymphocytes Relative: 13 %
MCH: 24.5 pg — AB (ref 26.0–34.0)
MCHC: 33.2 g/dL (ref 30.0–36.0)
MCV: 73.7 fL — AB (ref 78.0–100.0)
MONO ABS: 1.4 10*3/uL — AB (ref 0.1–1.0)
Monocytes Relative: 18 %
Neutro Abs: 5.4 10*3/uL (ref 1.7–7.7)
Neutrophils Relative %: 68 %
Platelets: 259 10*3/uL (ref 150–400)
RBC: 4.98 MIL/uL (ref 4.22–5.81)
RDW: 18.1 % — AB (ref 11.5–15.5)
WBC: 7.9 10*3/uL (ref 4.0–10.5)

## 2017-02-21 MED ORDER — PROMETHAZINE HCL 25 MG PO TABS: 25.0000 mg | ORAL_TABLET | Freq: Three times a day (TID) | ORAL | 0 refills | Status: AC | PRN

## 2017-02-21 MED ORDER — DIPHENHYDRAMINE HCL 50 MG/ML IJ SOLN
25.0000 mg | Freq: Once | INTRAMUSCULAR | Status: AC
  Administered 2017-02-21: 02:00:00 via INTRAVENOUS
  Filled 2017-02-21: qty 1

## 2017-02-21 MED ORDER — SODIUM CHLORIDE 0.9 % IV BOLUS (SEPSIS)
1000.0000 mL | Freq: Once | INTRAVENOUS | Status: AC
  Administered 2017-02-21: 1000 mL via INTRAVENOUS

## 2017-02-21 MED ORDER — METOCLOPRAMIDE HCL 5 MG/ML IJ SOLN
10.0000 mg | Freq: Once | INTRAMUSCULAR | Status: AC
  Administered 2017-02-21: 10 mg via INTRAVENOUS
  Filled 2017-02-21: qty 2

## 2017-02-21 NOTE — ED Provider Notes (Signed)
[facility] DEPT [facility] Provider Note   CSN: [id number] Arrival date & time: 02/20/17  2322     History   Chief Complaint Chief Complaint  Patient presents with  . Emesis    HPI Roy Arellano is a 26 y.o. male.  The history is provided by the patient.  Emesis   This is a new problem. The current episode started more than 2 days ago. The problem has been gradually worsening. There has been no fever. Associated symptoms include abdominal pain and chills. Pertinent negatives include no diarrhea.  Patient with known h/o UC (takes lialda) presents with vomiting over past 2-3 days It is nonbloody emesis He has had normal BM , no blood or melena He has had some abdominal pain but this is improved He was seen at [facility] ER recently for similar symptoms, felt improved but then the episode re-started   Past Medical History:  Diagnosis Date  . Bronchitis   . Seasonal allergies   . Ulcerative colitis [facility])     Patient Active Problem List   Diagnosis Date Noted  . Abdominal pain 09/15/2014  . Intussusception ([place]) 09/15/2014  . Ulcerative colitis ([place]) 09/15/2014  . Microcytic anemia 09/15/2014  . Thrombocytosis ([place]) 09/15/2014    History reviewed. No pertinent surgical history.     Home Medications    Prior to Admission medications   Medication Sig Start Date End Date Taking? Authorizing Provider  ondansetron (ZOFRAN-ODT) 4 MG disintegrating tablet Take 4 mg by mouth every 8 (eight) hours as needed for nausea or vomiting.   Yes [provider]  acetaminophen (TYLENOL) 500 MG tablet Take 1,000 mg by mouth every 6 (six) hours as needed for moderate pain or headache.    [provider]  guaiFENesin (MUCINEX) 600 MG 12 hr tablet Take 1 tablet (600 mg total) by mouth 2 (two) times daily. 05/29/13   Palumbo, April, MD  LIALDA 1.2 G EC tablet TAKE TWO TABLETS BY MOUTH DAILY**MUST VISIT PCP FOR CONTINUED THERAPY 07/25/11   Oletha Blend, MD    Family  History Family History  Problem Relation Age of Onset  . Diabetes Maternal Grandmother   . Ulcers Maternal Grandmother   . Ulcerative colitis Maternal Uncle     Social History Social History  Substance Use Topics  . Smoking status: Current Every Day Smoker    Packs/day: 0.50  . Smokeless tobacco: Never Used  . Alcohol use No     Allergies   Patient has no known allergies.   Review of Systems Review of Systems  Constitutional: Positive for chills.  Gastrointestinal: Positive for abdominal pain and vomiting. Negative for blood in stool and diarrhea.  All other systems reviewed and are negative.    Physical Exam Updated Vital Signs BP (!) 122/97   Pulse 96   Temp 98.5 F (36.9 C) (Oral)   Resp 18   Ht 1.727 m (5\' 8" )   Wt 68 kg (150 lb)   SpO2 99%   BMI 22.81 kg/m   Physical Exam CONSTITUTIONAL: Well developed/well nourished HEAD: Normocephalic/atraumatic EYES: EOMI/PERRL ENMT: Mucous membranes moist NECK: supple no meningeal signs SPINE/BACK:entire spine nontender CV: S1/S2 noted, no murmurs/rubs/gallops noted LUNGS: Lungs are clear to auscultation bilaterally, no apparent distress ABDOMEN: soft, nontender, no rebound or guarding, bowel sounds noted throughout abdomen GU:no cva tenderness NEURO: Pt is awake/alert/appropriate, moves all extremitiesx4.  No facial droop.   EXTREMITIES: pulses normal/equal, full ROM SKIN: warm, color normal PSYCH: no abnormalities of mood noted,  alert and oriented to situation   ED Treatments / Results  Labs (all labs ordered are listed, but only abnormal results are displayed) Labs Reviewed  CBC WITH DIFFERENTIAL/PLATELET - Abnormal; Notable for the following:       Result Value   Hemoglobin 12.2 (*)    HCT 36.7 (*)    MCV 73.7 (*)    MCH 24.5 (*)    RDW 18.1 (*)    Monocytes Absolute 1.4 (*)    All other components within normal limits  BASIC METABOLIC PANEL - Abnormal; Notable for the following:    Potassium 3.0  (*)    Chloride 99 (*)    Glucose, Bld 105 (*)    All other components within normal limits    EKG  EKG Interpretation None       Radiology No results found.  Procedures Procedures (including critical care time)  Medications Ordered in ED Medications  ondansetron (ZOFRAN-ODT) disintegrating tablet 4 mg (4 mg Oral Given 02/20/17 2332)  metoCLOPramide (REGLAN) injection 10 mg (10 mg Intravenous Given 02/21/17 0207)  diphenhydrAMINE (BENADRYL) injection 25 mg ( Intravenous Given 02/21/17 0207)  sodium chloride 0.9 % bolus 1,000 mL (1,000 mLs Intravenous New Bag/Given 02/21/17 0207)     Initial Impression / Assessment and Plan / ED Course  I have reviewed the triage vital signs and the nursing notes.  Pertinent labs  results that were available during my care of the patient were reviewed by me and considered in my medical decision making (see chart for details).     Pt improved He is taking PO fluids He is ambulatory No distress noted Overall well appearing Will d/c home He reports it has been 2+ years since seen by GI I advised close GI follwup for his UC I don't feel imaging required since labs reassuring and exam improved   We discussed strict ER return precautions   Final Clinical Impressions(s) / ED Diagnoses   Final diagnoses:  Non-intractable cyclical vomiting with nausea  Generalized abdominal pain    New Prescriptions New Prescriptions   PROMETHAZINE (PHENERGAN) 25 MG TABLET    Take 1 tablet (25 mg total) by mouth every 8 (eight) hours as needed for nausea or vomiting.     Ripley Fraise, MD 02/21/17 [phone]

## 2017-05-01 ENCOUNTER — Encounter (HOSPITAL_BASED_OUTPATIENT_CLINIC_OR_DEPARTMENT_OTHER): Payer: Self-pay | Admitting: Emergency Medicine

## 2017-05-01 ENCOUNTER — Emergency Department (HOSPITAL_BASED_OUTPATIENT_CLINIC_OR_DEPARTMENT_OTHER)
Admission: EM | Admit: 2017-05-01 | Discharge: 2017-05-01 | Disposition: A | Discharge status: Home / Self Care | Payer: 59 | Attending: Emergency Medicine | Admitting: Emergency Medicine

## 2017-05-01 ENCOUNTER — Other Ambulatory Visit: Payer: Self-pay

## 2017-05-01 DIAGNOSIS — F17200 Nicotine dependence, unspecified, uncomplicated: Secondary | ICD-10-CM | POA: Insufficient documentation

## 2017-05-01 DIAGNOSIS — Z09 Encounter for follow-up examination after completed treatment for conditions other than malignant neoplasm: Secondary | ICD-10-CM

## 2017-05-01 DIAGNOSIS — R112 Nausea with vomiting, unspecified: Secondary | ICD-10-CM | POA: Insufficient documentation

## 2017-05-01 DIAGNOSIS — J189 Pneumonia, unspecified organism: Secondary | ICD-10-CM | POA: Insufficient documentation

## 2017-05-01 NOTE — ED Triage Notes (Signed)
PT presents with wanting a follow up visit from [facility] on Friday. Pt states he went there for abdominal pain nausea and vomiting and chest pain and now wants to make sure all is ok before returning to work. PT reports he still has a small amount of abdominal pain

## 2017-05-01 NOTE — ED Provider Notes (Signed)
[facility] EMERGENCY DEPARTMENT Provider Note   CSN: [id number] Arrival date & time: 05/01/17  7829     History   Chief Complaint Chief Complaint  Patient presents with  . Follow-up    HPI Mali Olarte is a 26 y.o. male.  Patient here for follow-up.  Patient seen at [facility] emergency department on Friday.  For abdominal pain.  Patient had CT scan of the abdomen which raised some concern about the gallbladder.  This was followed up with an ultrasound which showed no acute findings.  Patient has had some difficulty in the past with syncopal type vomiting syndrome.  Patient also had a chest x-ray which raised some concern for bilateral pneumonias with a thought maybe was aspiration due to reflux.  Patient was started on antibiotics.  1 of these antibiotics with Zithromax.  Overall patient feeling much better.  He feels well enough to go back to work.  All symptoms have resolved other than that of some mild persistent right lower quadrant abdominal pain.  Cough and sore throat nausea and vomiting of all improved or resolved completely.      Past Medical History:  Diagnosis Date  . Bronchitis   . Seasonal allergies   . Ulcerative colitis [facility])     Patient Active Problem List   Diagnosis Date Noted  . Abdominal pain 09/15/2014  . Intussusception ([place]) 09/15/2014  . Ulcerative colitis ([place]) 09/15/2014  . Microcytic anemia 09/15/2014  . Thrombocytosis ([place]) 09/15/2014    History reviewed. No pertinent surgical history.     Home Medications      Family History Family History  Problem Relation Age of Onset  . Diabetes Maternal Grandmother   . Ulcers Maternal Grandmother   . Ulcerative colitis Maternal Uncle     Social History Social History   Tobacco Use  . Smoking status: Current Every Day Smoker    Packs/day: 0.50  . Smokeless tobacco: Never Used  Substance Use Topics  . Alcohol use: No    Alcohol/week: 0.0 oz  . Drug use: Yes   Types: Marijuana     Allergies   Patient has no known allergies.   Review of Systems Review of Systems  Constitutional: Negative for fever.  HENT: Positive for sore throat. Negative for congestion.   Eyes: Negative for visual disturbance.  Respiratory: Positive for cough.   Cardiovascular: Negative for chest pain.  Gastrointestinal: Positive for abdominal pain, nausea and vomiting.  Genitourinary: Negative for dysuria.  Musculoskeletal: Negative for back pain.  Skin: Negative for rash.  Neurological: Negative for headaches.  Hematological: Does not bruise/bleed easily.  Psychiatric/Behavioral: Negative for confusion.     Physical Exam Updated Vital Signs BP 129/74 (BP Location: Right Arm)   Pulse 89   Temp 98.9 F (37.2 C) (Oral)   Resp 18   Ht 1.727 m (5\' 8" )   Wt 65.8 kg (145 lb)   SpO2 100%   BMI 22.05 kg/m   Physical Exam  Constitutional: He is oriented to person, place, and time. He appears well-developed and well-nourished. No distress.  HENT:  Head: Normocephalic and atraumatic.  Mucous membranes slightly dry.  Eyes: Conjunctivae and EOM are normal. Pupils are equal, round, and reactive to light.  Neck: Normal range of motion. Neck supple.  Cardiovascular: Normal rate, regular rhythm and normal heart sounds.  Pulmonary/Chest: Effort normal and breath sounds normal. No respiratory distress. He has no wheezes. He has no rales.  Abdominal: Soft. Bowel sounds are normal. There  is no tenderness.  Musculoskeletal: Normal range of motion.  Neurological: He is alert and oriented to person, place, and time. No cranial nerve deficit or sensory deficit. He exhibits normal muscle tone. Coordination normal.  Skin: Skin is warm. No rash noted.  Nursing note and vitals reviewed.    ED Treatments / Results  Labs (all labs ordered are listed, but only abnormal results are displayed) Labs Reviewed - No data to display  EKG  EKG Interpretation None        Radiology No results found.  Procedures Procedures (including critical care time)  Medications Ordered in ED Medications - No data to display   Initial Impression / Assessment and Plan / ED Course  I have reviewed the triage vital signs and the nursing notes.  Pertinent labs & imaging results that were available during my care of the patient were reviewed by me and considered in my medical decision making (see chart for details).    Patient overall states he feels much better.  Feels as if he can go to back to work.  He still is on prednisone.  He is completed his antibiotic course.  Abdomen is soft no concern for any acute abdominal process.  Patient still appears clinically a little bit dehydrated.  But he says he is able to eat solid food and is drinking better now.  Patient is fine with returning to work today.   Final Clinical Impressions(s) / ED Diagnoses   Final diagnoses:  Nausea and vomiting, intractability of vomiting not specified, unspecified vomiting type  Community acquired pneumonia, unspecified laterality  Follow-up exam    ED Discharge Orders    None       Fredia Sorrow, MD 05/01/17 1002

## 2017-05-01 NOTE — Discharge Instructions (Signed)
Return for any new or worse symptoms.  Note provided to return to work today.  Continue your medications as directed.
